# Patient Record
Sex: Male | Born: 1985 | Race: White | Hispanic: No | Marital: Married | Smoking: Never smoker
Health system: Southern US, Community
[De-identification: ages and names within clinical notes are randomized; demographics above are authoritative.]

## PROBLEM LIST (undated history)

## (undated) DIAGNOSIS — N200 Calculus of kidney: Secondary | ICD-10-CM

## (undated) DIAGNOSIS — Z87442 Personal history of urinary calculi: Secondary | ICD-10-CM

## (undated) HISTORY — PX: OTHER SURGICAL HISTORY: SHX169

## (undated) HISTORY — DX: Calculus of kidney: N20.0

---

## 2009-03-13 ENCOUNTER — Ambulatory Visit: Payer: Self-pay | Admitting: Internal Medicine

## 2009-07-01 ENCOUNTER — Ambulatory Visit: Payer: Self-pay | Admitting: Internal Medicine

## 2010-05-07 ENCOUNTER — Emergency Department: Payer: Self-pay | Admitting: Emergency Medicine

## 2010-07-24 ENCOUNTER — Ambulatory Visit: Payer: Self-pay | Admitting: Internal Medicine

## 2012-09-29 ENCOUNTER — Ambulatory Visit: Payer: Self-pay

## 2013-03-17 ENCOUNTER — Ambulatory Visit: Payer: Self-pay | Admitting: Physician Assistant

## 2013-03-17 LAB — RAPID INFLUENZA A&B ANTIGENS (ARMC ONLY)

## 2013-03-17 LAB — RAPID STREP-A WITH REFLX: Micro Text Report: POSITIVE

## 2013-04-27 ENCOUNTER — Ambulatory Visit: Payer: Self-pay

## 2014-06-15 ENCOUNTER — Ambulatory Visit
Admit: 2014-06-15 | Disposition: A | Discharge status: Home / Self Care | Payer: Self-pay | Attending: Family Medicine | Admitting: Family Medicine

## 2014-06-15 LAB — RAPID STREP-A WITH REFLX: Micro Text Report: NEGATIVE

## 2014-06-18 LAB — BETA STREP CULTURE(ARMC)

## 2016-05-09 ENCOUNTER — Encounter: Payer: Self-pay | Admitting: Emergency Medicine

## 2016-05-09 ENCOUNTER — Emergency Department
Admission: EM | Admit: 2016-05-09 | Discharge: 2016-05-09 | Disposition: A | Discharge status: Home / Self Care | Payer: BLUE CROSS/BLUE SHIELD | Attending: Emergency Medicine | Admitting: Emergency Medicine

## 2016-05-09 ENCOUNTER — Emergency Department: Payer: BLUE CROSS/BLUE SHIELD

## 2016-05-09 DIAGNOSIS — R109 Unspecified abdominal pain: Secondary | ICD-10-CM

## 2016-05-09 DIAGNOSIS — N200 Calculus of kidney: Secondary | ICD-10-CM | POA: Diagnosis not present

## 2016-05-09 DIAGNOSIS — R1032 Left lower quadrant pain: Secondary | ICD-10-CM | POA: Diagnosis present

## 2016-05-09 LAB — URINALYSIS, COMPLETE (UACMP) WITH MICROSCOPIC
Bilirubin Urine: NEGATIVE
Glucose, UA: NEGATIVE mg/dL
Ketones, ur: NEGATIVE mg/dL
Leukocytes, UA: NEGATIVE
Nitrite: NEGATIVE
PH: 5 (ref 5.0–8.0)
Protein, ur: 30 mg/dL — AB
SPECIFIC GRAVITY, URINE: 1.026 (ref 1.005–1.030)

## 2016-05-09 LAB — CBC
HCT: 41.3 % (ref 40.0–52.0)
Hemoglobin: 14.1 g/dL (ref 13.0–18.0)
MCH: 30.8 pg (ref 26.0–34.0)
MCHC: 34.2 g/dL (ref 32.0–36.0)
MCV: 89.9 fL (ref 80.0–100.0)
Platelets: 145 10*3/uL — ABNORMAL LOW (ref 150–440)
RBC: 4.59 MIL/uL (ref 4.40–5.90)
RDW: 13.1 % (ref 11.5–14.5)
WBC: 8.6 10*3/uL (ref 3.8–10.6)

## 2016-05-09 LAB — BASIC METABOLIC PANEL
ANION GAP: 7 (ref 5–15)
BUN: 13 mg/dL (ref 6–20)
CHLORIDE: 102 mmol/L (ref 101–111)
CO2: 24 mmol/L (ref 22–32)
Calcium: 8.9 mg/dL (ref 8.9–10.3)
Creatinine, Ser: 1.18 mg/dL (ref 0.61–1.24)
GFR calc Af Amer: >60 mL/min (ref >60)
GFR calc non Af Amer: >60 mL/min (ref >60)
Glucose, Bld: 109 mg/dL — ABNORMAL HIGH (ref 65–99)
Potassium: 4 mmol/L (ref 3.5–5.1)
Sodium: 133 mmol/L — ABNORMAL LOW (ref 135–145)

## 2016-05-09 MED ORDER — HYDROMORPHONE HCL 2 MG PO TABS: 2.0000 mg | ORAL_TABLET | Freq: Two times a day (BID) | ORAL | 0 refills | Status: DC | PRN

## 2016-05-09 MED ORDER — ONDANSETRON HCL 4 MG/2ML IJ SOLN
4.0000 mg | Freq: Once | INTRAMUSCULAR | Status: AC
  Administered 2016-05-09: 4 mg via INTRAVENOUS
  Filled 2016-05-09: qty 2

## 2016-05-09 MED ORDER — KETOROLAC TROMETHAMINE 30 MG/ML IJ SOLN
15.0000 mg | Freq: Once | INTRAMUSCULAR | Status: AC
  Administered 2016-05-09: 15 mg via INTRAVENOUS
  Filled 2016-05-09: qty 1

## 2016-05-09 MED ORDER — OXYCODONE-ACETAMINOPHEN 5-325 MG PO TABS
1.0000 | ORAL_TABLET | ORAL | Status: DC | PRN
  Administered 2016-05-09: 1 via ORAL

## 2016-05-09 MED ORDER — ONDANSETRON HCL 4 MG PO TABS: 4.0000 mg | ORAL_TABLET | Freq: Three times a day (TID) | ORAL | 0 refills | Status: DC | PRN

## 2016-05-09 MED ORDER — IBUPROFEN 800 MG PO TABS: 800.0000 mg | ORAL_TABLET | Freq: Three times a day (TID) | ORAL | 0 refills | Status: DC | PRN

## 2016-05-09 MED ORDER — OXYCODONE-ACETAMINOPHEN 5-325 MG PO TABS
ORAL_TABLET | ORAL | Status: AC
  Filled 2016-05-09: qty 1

## 2016-05-09 MED ORDER — TAMSULOSIN HCL 0.4 MG PO CAPS: 0.4000 mg | ORAL_CAPSULE | Freq: Every day | ORAL | 0 refills | Status: AC

## 2016-05-09 MED ORDER — SODIUM CHLORIDE 0.9 % IV BOLUS (SEPSIS)
500.0000 mL | Freq: Once | INTRAVENOUS | Status: AC
  Administered 2016-05-09: 500 mL via INTRAVENOUS

## 2016-05-09 MED ORDER — HYDROMORPHONE HCL 1 MG/ML IJ SOLN
1.0000 mg | Freq: Once | INTRAMUSCULAR | Status: AC
  Administered 2016-05-09: 1 mg via INTRAVENOUS
  Filled 2016-05-09: qty 1

## 2016-05-09 MED ORDER — ONDANSETRON 4 MG PO TBDP
4.0000 mg | ORAL_TABLET | Freq: Once | ORAL | Status: AC | PRN
  Administered 2016-05-09: 4 mg via ORAL
  Filled 2016-05-09: qty 1

## 2016-05-09 NOTE — ED Notes (Signed)
Pt reports that he is having left flank pain that radiates into back - pain has been present since 9am - denies difficulty/pain/frequency with urination - urine does appear darker in color today - pt denies history of kidney stones - reports nausea - denies vomiting

## 2016-05-09 NOTE — ED Provider Notes (Signed)
Time Seen: Approximately 2007  I have reviewed the triage notes  Chief Complaint: Flank Pain   History of Present Illness: Nathan Flynn is a 31 y.o. male who presents with acute onset of left-sided lower abdominal pain and flank discomfort on the left. Patient states he has difficulty standing still. He took ibuprofen at home for pain. He has never had a history of renal colic. He denies any dysuria, hematuria or urinary frequency. He's had some nausea and periodic dry heaves without any hematemesis or biliary emesis.   History reviewed. No pertinent past medical history.  There are no active problems to display for this patient.   History reviewed. No pertinent surgical history.  History reviewed. No pertinent surgical history.    Allergies:  Septra [sulfamethoxazole-trimethoprim]  Family History: History reviewed. No pertinent family history.  Social History: Social History  Substance Use Topics  . Smoking status: Never Smoker  . Smokeless tobacco: Never Used  . Alcohol use No     Review of Systems:   10 point review of systems was performed and was otherwise negative:  Constitutional: No fever Eyes: No visual disturbances ENT: No sore throat, ear pain Cardiac: No chest pain Respiratory: No shortness of breath, wheezing, or stridor Abdomen:Left-sided lower abdominal pain to left flank pain with mild radiation toward the left groin area Endocrine: No weight loss, No night sweats Extremities: No peripheral edema, cyanosis Skin: No rashes, easy bruising Neurologic: No focal weakness, trouble with speech or swollowing Urologic: No dysuria, Hematuria, or urinary frequency He denies any testicular pain or masses  Physical Exam:  ED Triage Vitals  Enc Vitals Group     BP 05/09/16 1855 (!) 171/78     Pulse Rate 05/09/16 1855 (!) 102     Resp 05/09/16 1855 18     Temp 05/09/16 1855 99.1 F (37.3 C)     Temp Source 05/09/16 1855 Oral     SpO2  05/09/16 1855 99 %     Weight 05/09/16 1858 (!) 450 lb (204.1 kg)     Height 05/09/16 1858 6' (1.829 m)     Head Circumference --      Peak Flow --      Pain Score 05/09/16 1858 8     Pain Loc --      Pain Edu? --      Excl. in GC? --     General: Awake , Alert , and Oriented times 3; GCS 15 Head: Normal cephalic , atraumatic Eyes: Pupils equal , round, reactive to light Nose/Throat: No nasal drainage, patent upper airway without erythema or exudate.  Neck: Supple, Full range of motion, No anterior adenopathy or palpable thyroid masses Lungs: Clear to ascultation without wheezes , rhonchi, or rales Heart: Regular rate, regular rhythm without murmurs , gallops , or rubs Abdomen: Soft, non tender without rebound, guarding , or rigidity; bowel sounds positive and symmetric in all 4 quadrants. No organomegaly .        Extremities: 2 plus symmetric pulses. No edema, clubbing or cyanosis Neurologic: normal ambulation, Motor symmetric without deficits, sensory intact Skin: warm, dry, no rashes   Labs:   All laboratory work was reviewed including any pertinent negatives or positives listed below:  Labs Reviewed  URINALYSIS, COMPLETE (UACMP) WITH MICROSCOPIC - Abnormal; Notable for the following:       Result Value   Color, Urine YELLOW (*)    APPearance CLEAR (*)    Hgb urine dipstick LARGE (*)  Protein, ur 30 (*)    Bacteria, UA RARE (*)    Squamous Epithelial / LPF 0-5 (*)    All other components within normal limits  BASIC METABOLIC PANEL - Abnormal; Notable for the following:    Sodium 133 (*)    Glucose, Bld 109 (*)    All other components within normal limits  CBC - Abnormal; Notable for the following:    Platelets 145 (*)    All other components within normal limits    Radiology:  "Ct Renal Stone Study  Result Date: 05/09/2016 CLINICAL DATA:  Left flank pain radiating to left lower quadrant. Dark urine. Nausea. EXAM: CT ABDOMEN AND PELVIS WITHOUT CONTRAST TECHNIQUE:  Multidetector CT imaging of the abdomen and pelvis was performed following the standard protocol without IV contrast. COMPARISON:  None. FINDINGS: Lower chest: Lung bases are normal. Hepatobiliary: Within normal. Pancreas: Within normal. Spleen: Within normal. Adrenals/Urinary Tract: Adrenal glands are normal. Kidneys are normal in size with mild bilateral nephrolithiasis. Mild dilatation of the left intrarenal collecting system as there is a 7 mm stone at the UPJ causing this low-grade obstruction. Mild stranding of the left perinephric fat. Remainder of the left ureter is normal. The right ureter is normal. Bladder is unremarkable. Stomach/Bowel: Stomach and small bowel are within normal. Appendix is normal. Colon is within normal. Vascular/Lymphatic: Within normal. Reproductive: Within normal. Other: None. Musculoskeletal: Mild degenerate change of the spine. 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 Mild degenerate change of the hips. IMPRESSION: Mild bilateral nephrolithiasis. 7 mm stone at the left UPJ causing low-grade obstruction. Electronically Signed   By: Elberta Fortisaniel  Boyle M.D.   On: 05/09/2016 21:29  "  I personally reviewed the radiologic studies     ED Course: * Patient's stay here was uneventful he had little relief with oral narcotics was given IV Toradol, IV Dilaudid, and IV Zofran with symptomatic relief. It appears he has a fairly large kidney stone located in the left UPJ region. Patient was referred to urology unassigned and advised to strain his urine and save the stone if one becomes available.     Assessment: Left-sided renal colic Final Clinical Impression:  Final diagnoses:  Left flank discomfort     Plan:  Outpatient " New Prescriptions   HYDROMORPHONE (DILAUDID) 2 MG TABLET    Take 1 tablet (2 mg total) by mouth every 12 (twelve) hours as needed for severe pain.   IBUPROFEN (ADVIL,MOTRIN) 800 MG TABLET    Take 1 tablet (800 mg total) by mouth every 8 (eight) hours as needed.    ONDANSETRON (ZOFRAN) 4 MG TABLET    Take 1 tablet (4 mg total) by mouth every 8 (eight) hours as needed for nausea or vomiting.   TAMSULOSIN (FLOMAX) 0.4 MG CAPS CAPSULE    Take 1 capsule (0.4 mg total) by mouth daily.  " Patient was advised to return immediately if condition worsens. Patient was advised to follow up with their primary care physician or other specialized physicians involved in their outpatient care. The patient and/or family member/power of attorney had laboratory results reviewed at the bedside. All questions and concerns were addressed and appropriate discharge instructions were distributed by the nursing staff.             Jennye MoccasinBrian S Quigley, MD 05/09/16 2154

## 2016-05-09 NOTE — ED Triage Notes (Addendum)
C/o left flank pain radiating to LLQ. Denies blood in urine but is dark in color per pt. Has had nausea. Denies fevers. Appears to hurt but no distress currently. Has episode of this pain last Saturday but went away same day.

## 2016-05-09 NOTE — Discharge Instructions (Signed)
Please drink adequate fluids and strain urine and stated this started one becomes available. Please return emergency Department for a fever, uncontrolled vomiting, uncontrolled pain. Please contact urologist on Monday for further outpatient follow-up as directed.  Please return immediately if condition worsens. Please contact her primary physician or the physician you were given for referral. If you have any specialist physicians involved in her treatment and plan please also contact them. Thank you for using  regional emergency Department.

## 2016-05-21 ENCOUNTER — Other Ambulatory Visit: Payer: Self-pay | Admitting: Radiology

## 2016-05-21 ENCOUNTER — Encounter: Payer: Self-pay | Admitting: Urology

## 2016-05-21 ENCOUNTER — Ambulatory Visit: Payer: BLUE CROSS/BLUE SHIELD | Admitting: Urology

## 2016-05-21 VITALS — BP 150/82 | HR 109 | Ht 72.0 in | Wt >= 6400 oz

## 2016-05-21 DIAGNOSIS — R310 Gross hematuria: Secondary | ICD-10-CM | POA: Diagnosis not present

## 2016-05-21 DIAGNOSIS — N200 Calculus of kidney: Secondary | ICD-10-CM

## 2016-05-21 DIAGNOSIS — N132 Hydronephrosis with renal and ureteral calculous obstruction: Secondary | ICD-10-CM

## 2016-05-21 DIAGNOSIS — N201 Calculus of ureter: Secondary | ICD-10-CM

## 2016-05-21 LAB — URINALYSIS, COMPLETE
Bilirubin, UA: NEGATIVE
Glucose, UA: NEGATIVE
Ketones, UA: NEGATIVE
Leukocytes, UA: NEGATIVE
Nitrite, UA: NEGATIVE
PH UA: 5.5 (ref 5.0–7.5)
PROTEIN UA: NEGATIVE
Specific Gravity, UA: 1.025 (ref 1.005–1.030)
UUROB: 0.2 mg/dL (ref 0.2–1.0)

## 2016-05-21 LAB — MICROSCOPIC EXAMINATION

## 2016-05-21 MED ORDER — TAMSULOSIN HCL 0.4 MG PO CAPS: 0.4000 mg | ORAL_CAPSULE | Freq: Every day | ORAL | 0 refills | Status: DC

## 2016-05-21 MED ORDER — HYDROMORPHONE HCL 2 MG PO TABS: 2.0000 mg | ORAL_TABLET | Freq: Two times a day (BID) | ORAL | 0 refills | Status: DC | PRN

## 2016-05-21 NOTE — Progress Notes (Signed)
  05/21/2016 4:35 PM   Nathan Flynn 03/19/1985 [id number]  Referring provider: No referring provider defined for this encounter.  Chief Complaint  Patient presents with  . New Patient (Initial Visit)    kidney stone referred by ER     HPI: Patient is a 31 year old Caucasian male who presents/is referred by [facility]'s ED for nephrolithiasis.  Patient states the onset of the pain was 2 weeks ago.   It was sharp.  It lasted for a few hours  The pain was located left flank and radiated to left abdomen.    The pain was a 10/10.  Narcotics made the pain better.   Nothing made the pain worse.  He did have gross hematuria, nausea and vomiting.  In the ED , hydromorphone, ketorolac and ondansetrone.   His UA demonstrated TNTC RBC's.   Serum creatinine was 1.18.  WBC count was 8.6.    CT Renal stone study performed on 05/09/2016 noted mild bilateral nephrolithiasis. 7 mm stone at the left UPJ causing low-grade obstruction.  I have independently reviewed the films.    Today, he is having left flank pain.  He is unable to get comfortable in the room.  He denies gross hematuria and dysuria.  He denies fevers, chills and vomiting.  He is having nausea.  UA today is positive 11-30 RBC's.    He does not have a prior history of stones.      PMH: Past Medical History:  Diagnosis Date  . Kidney stone     Surgical History: History reviewed. No pertinent surgical history.  Home Medications:  Allergies as of 05/21/2016      Reactions   Septra [sulfamethoxazole-trimethoprim]    "twin was allergic so told me not to take"      Medication List       Accurate as of 05/21/16  4:35 PM. Always use your most recent med list.          acetaminophen 500 MG chewable tablet Commonly known as:  TYLENOL Chew 500 mg by mouth every 6 (six) hours as needed for pain.   HYDROmorphone 2 MG tablet Commonly known as:  DILAUDID Take 1 tablet (2 mg total) by mouth every 12 (twelve) hours as  needed for severe pain.   ibuprofen 800 MG tablet Commonly known as:  ADVIL,MOTRIN Take 1 tablet (800 mg total) by mouth every 8 (eight) hours as needed.   ondansetron 4 MG tablet Commonly known as:  ZOFRAN Take 1 tablet (4 mg total) by mouth every 8 (eight) hours as needed for nausea or vomiting.   tamsulosin 0.4 MG Caps capsule Commonly known as:  FLOMAX Take 1 capsule (0.4 mg total) by mouth daily.       Allergies:  Allergies  Allergen Reactions  . Septra [Sulfamethoxazole-Trimethoprim]     "twin was allergic so told me not to take"    Family History: Family History  Problem Relation Age of Onset  . Prostate cancer Neg Hx   . Kidney cancer Neg Hx   . Bladder Cancer Neg Hx     Social History:  reports that he has never smoked. He has never used smokeless tobacco. He reports that he does not drink alcohol or use drugs.  ROS: UROLOGY Frequent Urination?: Yes Hard to postpone urination?: No Burning/pain with urination?: Yes Get up at night to urinate?: No Leakage of urine?: No Urine stream starts and stops?: No Trouble starting stream?: No Do you have to strain   to urinate?: No Blood in urine?: Yes Urinary tract infection?: No Sexually transmitted disease?: No Injury to kidneys or bladder?: No Painful intercourse?: No Weak stream?: No Erection problems?: No Penile pain?: No  Gastrointestinal Nausea?: Yes Vomiting?: Yes Indigestion/heartburn?: No Diarrhea?: No Constipation?: Yes  Constitutional Fever: No Night sweats?: No Weight loss?: No Fatigue?: Yes  Skin Skin rash/lesions?: No Itching?: No  Eyes Blurred vision?: No Double vision?: No  Ears/Nose/Throat Sore throat?: No Sinus problems?: Yes  Hematologic/Lymphatic Swollen glands?: No Easy bruising?: No  Cardiovascular Leg swelling?: Yes Chest pain?: No  Respiratory Cough?: No Shortness of breath?: Yes  Endocrine Excessive thirst?: No  Musculoskeletal Back pain?: Yes Joint  pain?: No  Neurological Headaches?: Yes Dizziness?: Yes  Psychologic Depression?: No Anxiety?: No  Physical Exam: BP (!) 150/82   Pulse (!) 109   Ht 6' (1.829 m)   Wt (!) 483 lb 3.2 oz (219.2 kg)   BMI 65.53 kg/m   Constitutional: Well nourished. Alert and oriented, No acute distress. HEENT: [place] AT, moist mucus membranes. Trachea midline, no masses. Cardiovascular: No clubbing, cyanosis, or edema. Respiratory: Normal respiratory effort, no increased work of breathing. GI: Abdomen is soft, non tender, non distended, no abdominal masses. Liver and spleen not palpable.  No hernias appreciated.  Stool sample for occult testing is not indicated.   GU: No CVA tenderness.  No bladder fullness or masses.   Skin: No rashes, bruises or suspicious lesions. Lymph: No cervical or inguinal adenopathy. Neurologic: Grossly intact, no focal deficits, moving all 4 extremities. Psychiatric: Normal mood and affect.  Laboratory Data: Lab Results  Component Value Date   WBC 8.6 05/09/2016   HGB 14.1 05/09/2016   HCT 41.3 05/09/2016   MCV 89.9 05/09/2016   PLT 145 (L) 05/09/2016    Lab Results  Component Value Date   CREATININE 1.18 05/09/2016   Urinalysis 11-30 RBC's.  See EPIC.   Pertinent Imaging: CLINICAL DATA:  Left flank pain radiating to left lower quadrant. Dark urine. Nausea.  EXAM: CT ABDOMEN AND PELVIS WITHOUT CONTRAST  TECHNIQUE: Multidetector CT imaging of the abdomen and pelvis was performed following the standard protocol without IV contrast.  COMPARISON:  None.  FINDINGS: Lower chest: Lung bases are normal.  Hepatobiliary: Within normal.  Pancreas: Within normal.  Spleen: Within normal.  Adrenals/Urinary Tract: Adrenal glands are normal. Kidneys are normal in size with mild bilateral nephrolithiasis. Mild dilatation of the left intrarenal collecting system as there is a 7 mm stone at the UPJ causing this low-grade obstruction. Mild stranding of  the left perinephric fat. Remainder of the left ureter is normal. The right ureter is normal. Bladder is unremarkable.  Stomach/Bowel: Stomach and small bowel are within normal. Appendix is normal. Colon is within normal.  Vascular/Lymphatic: Within normal.  Reproductive: Within normal.  Other: None.  Musculoskeletal: Mild degenerate change of the spine. 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 Mild degenerate change of the hips.  IMPRESSION: Mild bilateral nephrolithiasis. 7 mm stone at the left UPJ causing low-grade obstruction.   Electronically Signed   By: Daniel  Boyle M.D.   On: 05/09/2016 21:29   Assessment & Plan:   Patient will undergo left ureteroscopy with left laser lithotripsy with left ureteral stent placement for definitive treatment of a left 7 mm UPJ and left renal stone.    1. Left UPJ stone  - discussed MET vs ESWL vs URS/LL/ureteral stent placement and the success rate of each - recommend URS/LL/ureteral stent placement as it would give   him the best chance of becoming stone free due to size of the stone and the skin to stone distance  - schedule LEFT ureteroscopy with laser lithotripsy and ureteral stent placement  - explained to the patient how the procedure is performed and the risks involved  - informed patient that they will have a stent placed during the procedure and will remain in place after the procedure for a short time.   - stent may be removed in the office with a cystoscope or patient may be instructed to remove the stent themselves by the string  - described "stent pain" as feelings of needing to urinate/overactive bladder and a warm, tingling sensation to intense pain in the affected flank  - residual stones within the kidney or ureter may be present after the procedure and may need to have these addressed at a different encounter  - injury to the ureter is the most common intra-operative risk, it may result in an open procedure to correct the  defect  - infection and bleeding are also risks  - explained the risks of general anesthesia, such as: MI, CVA, paralysis, coma and/or death.  - advised to contact our office or seek treatment in the ED if becomes febrile or pain/ vomiting are difficult control in order to arrange for emergent/urgent intervention  2. Left renal stone  - see above  3. Left hydronephrosis  - obtain RUS to ensure the hydronephrosis has resolved once intervention has completed  3. Gross hematuria  - UA today demonstrates 11-30 RBC's.    - continue to monitor the patient's UA after the treatment/passage of the stone to ensure the hematuria has resolved  - if hematuria persists, we will pursue a hematuria workup with CT Urogram and cystoscopy if appropriate.    Return for left URS/LL/ureteral stent placement.  These notes generated with voice recognition software. I apologize for typographical errors.  Shamir Tuzzolino, PA-C  [facility] [address], [address] [place], [place] [address] [phone]  

## 2016-05-22 ENCOUNTER — Encounter
Admission: RE | Admit: 2016-05-22 | Discharge: 2016-05-22 | Disposition: A | Discharge status: Home / Self Care | Payer: BLUE CROSS/BLUE SHIELD | Source: Ambulatory Visit | Attending: Urology | Admitting: Urology

## 2016-05-22 DIAGNOSIS — N135 Crossing vessel and stricture of ureter without hydronephrosis: Secondary | ICD-10-CM | POA: Diagnosis not present

## 2016-05-22 DIAGNOSIS — Z883 Allergy status to other anti-infective agents status: Secondary | ICD-10-CM | POA: Diagnosis not present

## 2016-05-22 DIAGNOSIS — Z87442 Personal history of urinary calculi: Secondary | ICD-10-CM | POA: Diagnosis not present

## 2016-05-22 DIAGNOSIS — Z79899 Other long term (current) drug therapy: Secondary | ICD-10-CM | POA: Diagnosis not present

## 2016-05-22 DIAGNOSIS — Z79891 Long term (current) use of opiate analgesic: Secondary | ICD-10-CM | POA: Diagnosis not present

## 2016-05-22 DIAGNOSIS — Z791 Long term (current) use of non-steroidal anti-inflammatories (NSAID): Secondary | ICD-10-CM | POA: Diagnosis not present

## 2016-05-22 DIAGNOSIS — R310 Gross hematuria: Secondary | ICD-10-CM | POA: Diagnosis not present

## 2016-05-22 DIAGNOSIS — N201 Calculus of ureter: Secondary | ICD-10-CM | POA: Diagnosis present

## 2016-05-22 NOTE — Patient Instructions (Signed)
Your procedure is scheduled on: 05/25/16 at 10:45 Report to DAY SURGERY. 2ND FLOOR MEDICAL MALL ENTRANCE. To find out your arrival time please call [phone] between 1PM - 3PM on .  Remember: Instructions that are not followed completely may result in serious medical risk, up to and including death, or upon the discretion of your surgeon and anesthesiologist your surgery may need to be rescheduled.    __X__ 1. Do not eat food or drink liquids after midnight. No gum chewing or hard candies.     __X__ 2. No Alcohol for 24 hours before or after surgery.   ____ 3. Bring all medications with you on the day of surgery if instructed.    __X__ 4. Notify your doctor if there is any change in your medical condition     (cold, fever, infections).             ___X__5. No smoking within 24 hours of your surgery.     Do not wear jewelry, make-up, hairpins, clips or nail polish.  Do not wear lotions, powders, or perfumes.   Do not shave 48 hours prior to surgery. Men may shave face and neck.  Do not bring valuables to the hospital.    [facility] is not responsible for any belongings or valuables.               Contacts, dentures or bridgework may not be worn into surgery.  Leave your suitcase in the car. After surgery it may be brought to your room.  For patients admitted to the hospital, discharge time is determined by your                treatment team.   Patients discharged the day of surgery will not be allowed to drive home.   Please read over the following fact sheets that you were given:   Pain Booklet and MRSA Information   ____ Take these medicines the morning of surgery with A SIP OF WATER:    1. MAY TAKE PAIN MEDICINE IF NEEDED  2.   3.   4.  5.  6.  ____ Fleet Enema (as directed)   ____ Use CHG Soap as directed  ____ Use inhalers on the day of surgery  ____ Stop metformin 2 days prior to surgery    ____ Take 1/2 of usual insulin dose the night before surgery and none  on the morning of surgery.   ____ Stop Coumadin/Plavix/aspirin on   __X__ Stop Anti-inflammatories such as Advil, Aleve, Ibuprofen, Motrin, Naproxen, Naprosyn, Goodies,powder, or aspirin products.  OK to take Tylenol.   ____ Stop supplements until after surgery.    ____ Bring C-Pap to the hospital.

## 2016-05-24 LAB — CULTURE, URINE COMPREHENSIVE

## 2016-05-24 MED ORDER — CEFAZOLIN SODIUM-DEXTROSE 2-4 GM/100ML-% IV SOLN
2.0000 g | INTRAVENOUS | Status: AC
  Administered 2016-05-25: 2 g via INTRAVENOUS

## 2016-05-25 ENCOUNTER — Ambulatory Visit: Payer: BLUE CROSS/BLUE SHIELD | Admitting: Anesthesiology

## 2016-05-25 ENCOUNTER — Ambulatory Visit: Payer: BLUE CROSS/BLUE SHIELD

## 2016-05-25 ENCOUNTER — Encounter
Admission: RE | Disposition: A | Discharge status: Home / Self Care | Payer: Self-pay | Source: Ambulatory Visit | Attending: Urology

## 2016-05-25 ENCOUNTER — Telehealth: Payer: Self-pay | Admitting: Urology

## 2016-05-25 ENCOUNTER — Encounter: Payer: Self-pay | Admitting: *Deleted

## 2016-05-25 ENCOUNTER — Ambulatory Visit
Admission: RE | Admit: 2016-05-25 | Discharge: 2016-05-25 | Disposition: A | Discharge status: Home / Self Care | Payer: BLUE CROSS/BLUE SHIELD | Source: Ambulatory Visit | Attending: Urology | Admitting: Urology

## 2016-05-25 DIAGNOSIS — Z87442 Personal history of urinary calculi: Secondary | ICD-10-CM | POA: Insufficient documentation

## 2016-05-25 DIAGNOSIS — Z791 Long term (current) use of non-steroidal anti-inflammatories (NSAID): Secondary | ICD-10-CM | POA: Insufficient documentation

## 2016-05-25 DIAGNOSIS — R1032 Left lower quadrant pain: Secondary | ICD-10-CM | POA: Diagnosis not present

## 2016-05-25 DIAGNOSIS — N135 Crossing vessel and stricture of ureter without hydronephrosis: Secondary | ICD-10-CM | POA: Diagnosis not present

## 2016-05-25 DIAGNOSIS — N201 Calculus of ureter: Secondary | ICD-10-CM | POA: Insufficient documentation

## 2016-05-25 DIAGNOSIS — Z79899 Other long term (current) drug therapy: Secondary | ICD-10-CM | POA: Insufficient documentation

## 2016-05-25 DIAGNOSIS — Z419 Encounter for procedure for purposes other than remedying health state, unspecified: Secondary | ICD-10-CM

## 2016-05-25 DIAGNOSIS — N132 Hydronephrosis with renal and ureteral calculous obstruction: Secondary | ICD-10-CM

## 2016-05-25 DIAGNOSIS — Z883 Allergy status to other anti-infective agents status: Secondary | ICD-10-CM | POA: Insufficient documentation

## 2016-05-25 DIAGNOSIS — R310 Gross hematuria: Secondary | ICD-10-CM | POA: Insufficient documentation

## 2016-05-25 DIAGNOSIS — Z79891 Long term (current) use of opiate analgesic: Secondary | ICD-10-CM | POA: Insufficient documentation

## 2016-05-25 DIAGNOSIS — N200 Calculus of kidney: Secondary | ICD-10-CM

## 2016-05-25 HISTORY — PX: URETEROSCOPY: SHX842

## 2016-05-25 HISTORY — PX: CYSTOSCOPY WITH STENT PLACEMENT: SHX5790

## 2016-05-25 SURGERY — URETEROSCOPY
Anesthesia: General | Site: Ureter | Laterality: Left | Wound class: Clean Contaminated

## 2016-05-25 MED ORDER — MIDAZOLAM HCL 2 MG/2ML IJ SOLN
INTRAMUSCULAR | Status: AC
  Filled 2016-05-25: qty 2

## 2016-05-25 MED ORDER — FENTANYL CITRATE (PF) 100 MCG/2ML IJ SOLN
INTRAMUSCULAR | Status: AC
  Filled 2016-05-25: qty 2

## 2016-05-25 MED ORDER — LACTATED RINGERS IV SOLN
INTRAVENOUS | Status: DC
  Administered 2016-05-25: 11:00:00 via INTRAVENOUS

## 2016-05-25 MED ORDER — DEXAMETHASONE SODIUM PHOSPHATE 10 MG/ML IJ SOLN
INTRAMUSCULAR | Status: DC | PRN
  Administered 2016-05-25: 4 mg via INTRAVENOUS

## 2016-05-25 MED ORDER — PROPOFOL 10 MG/ML IV BOLUS
INTRAVENOUS | Status: AC
  Filled 2016-05-25: qty 20

## 2016-05-25 MED ORDER — PROPOFOL 10 MG/ML IV BOLUS
INTRAVENOUS | Status: DC | PRN
  Administered 2016-05-25: 300 mg via INTRAVENOUS

## 2016-05-25 MED ORDER — MIDAZOLAM HCL 2 MG/2ML IJ SOLN
INTRAMUSCULAR | Status: DC | PRN
  Administered 2016-05-25: 2 mg via INTRAVENOUS

## 2016-05-25 MED ORDER — ONDANSETRON HCL 4 MG/2ML IJ SOLN
INTRAMUSCULAR | Status: DC | PRN
  Administered 2016-05-25: 4 mg via INTRAVENOUS

## 2016-05-25 MED ORDER — HYDROMORPHONE HCL 2 MG PO TABS
ORAL_TABLET | ORAL | Status: AC
  Filled 2016-05-25: qty 1

## 2016-05-25 MED ORDER — ONDANSETRON HCL 4 MG/2ML IJ SOLN: 4.0000 mg | Freq: Once | INTRAMUSCULAR | Status: DC | PRN

## 2016-05-25 MED ORDER — HYDROMORPHONE HCL 2 MG PO TABS: 2.0000 mg | ORAL_TABLET | Freq: Two times a day (BID) | ORAL | 0 refills | Status: DC | PRN

## 2016-05-25 MED ORDER — CEFAZOLIN SODIUM 1 G IJ SOLR
INTRAMUSCULAR | Status: DC | PRN
  Administered 2016-05-25: 1 g via INTRAMUSCULAR

## 2016-05-25 MED ORDER — LIDOCAINE HCL (CARDIAC) 20 MG/ML IV SOLN
INTRAVENOUS | Status: DC | PRN
  Administered 2016-05-25: 100 mg via INTRAVENOUS

## 2016-05-25 MED ORDER — FAMOTIDINE 20 MG PO TABS
ORAL_TABLET | ORAL | Status: AC
  Filled 2016-05-25: qty 1

## 2016-05-25 MED ORDER — FAMOTIDINE 20 MG PO TABS
20.0000 mg | ORAL_TABLET | Freq: Once | ORAL | Status: AC
  Administered 2016-05-25: 20 mg via ORAL

## 2016-05-25 MED ORDER — CEFAZOLIN SODIUM-DEXTROSE 2-4 GM/100ML-% IV SOLN
INTRAVENOUS | Status: AC
  Filled 2016-05-25: qty 100

## 2016-05-25 MED ORDER — HYDROMORPHONE HCL 2 MG PO TABS
2.0000 mg | ORAL_TABLET | Freq: Two times a day (BID) | ORAL | Status: DC | PRN
  Administered 2016-05-25: 2 mg via ORAL

## 2016-05-25 MED ORDER — ONDANSETRON HCL 4 MG/2ML IJ SOLN
INTRAMUSCULAR | Status: AC
  Filled 2016-05-25: qty 2

## 2016-05-25 MED ORDER — SUCCINYLCHOLINE CHLORIDE 20 MG/ML IJ SOLN
INTRAMUSCULAR | Status: AC
  Filled 2016-05-25: qty 1

## 2016-05-25 MED ORDER — PROMETHAZINE HCL 12.5 MG RE SUPP: 12.5000 mg | Freq: Four times a day (QID) | RECTAL | 0 refills | Status: DC | PRN

## 2016-05-25 MED ORDER — FENTANYL CITRATE (PF) 100 MCG/2ML IJ SOLN: 25.0000 ug | INTRAMUSCULAR | Status: DC | PRN

## 2016-05-25 MED ORDER — OXYBUTYNIN CHLORIDE 5 MG PO TABS: 5.0000 mg | ORAL_TABLET | Freq: Three times a day (TID) | ORAL | 0 refills | Status: DC | PRN

## 2016-05-25 MED ORDER — OXYBUTYNIN CHLORIDE 5 MG PO TABS
5.0000 mg | ORAL_TABLET | Freq: Once | ORAL | Status: AC
  Administered 2016-05-25: 5 mg via ORAL
  Filled 2016-05-25: qty 1

## 2016-05-25 MED ORDER — TAMSULOSIN HCL 0.4 MG PO CAPS: 0.4000 mg | ORAL_CAPSULE | Freq: Every day | ORAL | 0 refills | Status: DC

## 2016-05-25 MED ORDER — FENTANYL CITRATE (PF) 100 MCG/2ML IJ SOLN
INTRAMUSCULAR | Status: DC | PRN
  Administered 2016-05-25: 100 ug via INTRAVENOUS
  Administered 2016-05-25 (×2): 50 ug via INTRAVENOUS

## 2016-05-25 MED ORDER — SUCCINYLCHOLINE CHLORIDE 20 MG/ML IJ SOLN
INTRAMUSCULAR | Status: DC | PRN
  Administered 2016-05-25: 180 mg via INTRAVENOUS

## 2016-05-25 MED ORDER — IOTHALAMATE MEGLUMINE 43 % IV SOLN
INTRAVENOUS | Status: DC | PRN
Start: 2016-05-25 — End: 2016-05-25
  Administered 2016-05-25: 20 mL

## 2016-05-25 SURGICAL SUPPLY — 32 items
BAG DRAIN CYSTO-URO LG1000N (MISCELLANEOUS) ×4 IMPLANT
BASKET ZERO TIP 1.9FR (BASKET) IMPLANT
CATH URETL 5X70 OPEN END (CATHETERS) ×4 IMPLANT
CNTNR SPEC 2.5X3XGRAD LEK (MISCELLANEOUS) ×2
CONRAY 43 FOR UROLOGY 50M (MISCELLANEOUS) ×4 IMPLANT
CONT SPEC 4OZ STER OR WHT (MISCELLANEOUS) ×2
CONTAINER SPEC 2.5X3XGRAD LEK (MISCELLANEOUS) ×2 IMPLANT
DRAPE C-ARM XRAY 36X54 (DRAPES) ×8 IMPLANT
DRAPE UTILITY 15X26 TOWEL STRL (DRAPES) ×4 IMPLANT
GLOVE BIO SURGEON STRL SZ 6.5 (GLOVE) ×3 IMPLANT
GLOVE BIO SURGEONS STRL SZ 6.5 (GLOVE) ×1
GOWN STRL REUS W/ TWL LRG LVL3 (GOWN DISPOSABLE) ×4 IMPLANT
GOWN STRL REUS W/TWL LRG LVL3 (GOWN DISPOSABLE) ×4
GUIDEWIRE GREEN .038 145CM (MISCELLANEOUS) ×4 IMPLANT
INFUSOR MANOMETER BAG 3000ML (MISCELLANEOUS) ×4 IMPLANT
INTRODUCER DILATOR DOUBLE (INTRODUCER) IMPLANT
KIT RM TURNOVER CYSTO AR (KITS) ×4 IMPLANT
PACK CYSTO AR (MISCELLANEOUS) ×4 IMPLANT
SCRUB POVIDONE IODINE 4 OZ (MISCELLANEOUS) ×4 IMPLANT
SENSORWIRE 0.038 NOT ANGLED (WIRE) ×4
SET CYSTO W/LG BORE CLAMP LF (SET/KITS/TRAYS/PACK) ×4 IMPLANT
SET DILATOR URETRAL 8.5FR (MISCELLANEOUS) ×4 IMPLANT
SHEATH URETERAL 12FRX35CM (MISCELLANEOUS) IMPLANT
SOL .9 NS 3000ML IRR  AL (IV SOLUTION) ×2
SOL .9 NS 3000ML IRR UROMATIC (IV SOLUTION) ×2 IMPLANT
STENT URET 6FRX24 CONTOUR (STENTS) IMPLANT
STENT URET 6FRX26 CONTOUR (STENTS) IMPLANT
STENT URET 6FRX28 CONTOUR (STENTS) ×4 IMPLANT
SURGILUBE 2OZ TUBE FLIPTOP (MISCELLANEOUS) ×4 IMPLANT
SYRINGE IRR TOOMEY STRL 70CC (SYRINGE) ×4 IMPLANT
WATER STERILE IRR 1000ML POUR (IV SOLUTION) ×4 IMPLANT
WIRE SENSOR 0.038 NOT ANGLED (WIRE) ×2 IMPLANT

## 2016-05-25 NOTE — Op Note (Signed)
Date of procedure: 05/25/16  Preoperative diagnosis:  1. Left UPJ stone 2. Left flank pain   Postoperative diagnosis:  1. Same as above   Procedure: 1. Left ureteroscopy 2. Left ureteral stent placement 3. Left retrograde pyelogram  Surgeon: Vanna Scotland, MD  Anesthesia: General  Complications: None  Intraoperative findings: Unable to advance a flexible scope passed iliacs due to patient habitus and ureteral narrowing. Stent placed with plans for staged procedure.  EBL: Minimal  Specimens: None  Drains: 6 x 28 French double-J ureteral stent on left  Indication: Nathan Flynn is a 31 y.o. patient with with a 7 mm left UPJ stone and continued poorly controlled left flank pain.  After reviewing the management options for treatment, he elected to proceed with the above surgical procedure(s). We have discussed the potential benefits and risks of the procedure, side effects of the proposed treatment, the likelihood of the patient achieving the goals of the procedure, and any potential problems that might occur during the procedure or recuperation. Informed consent has been obtained.  Description of procedure:  The patient was taken to the operating room and general anesthesia was induced.  The patient was placed in the dorsal lithotomy position, prepped and draped in the usual sterile fashion, and preoperative antibiotics were administered.  Due to the patient's habitus, extreme care was taken to ensure all padded pressure points and slaughters were used throughout the entire procedure to ensure patient stability on the table and in yellowfins.  A preoperative time-out was performed.   Male sounds were used to gently dilate the fossa navicularis. 21 [place] scope was advanced then per urethra into the bladder. Attention was turned to the left ureteral orifice which was cannulated using a 5 [place] open-ended ureteral catheter. Prior to performing a retrograde pyelogram on the  side, scout films revealed no obvious stone although quality of this was poor based on the patient's habitus. A gentle retrograde pyelogram was then performed revealing some fullness in the collecting system but no overt hydronephrosis. No clear filling defect was appreciated. Wire was then placed up to level of the kidney under fluoroscopic guidance. I then attempted to use a dual lumen introducer within the distal ureter but was not able to advance this easily. Using an open-ended ureteral catheter, a second Super Stiff wire was then placed cystoscopically without difficulty up to the level of the kidney. Then attempted to use an 8/10 dilator to help dilate the UO but was unable to advance the 8 [place] dilator past the iliacs. I then attempted to advance a 4.5 French semirigid ureteroscope to this level but is only able to cannulate and advanced to the distal ureter but not beyond the iliacs due to his habitus. I then used an 8 [place] dual-lumen Wolf ureteroscope again meeting resistance at the iliacs unable to advance the scope easily. I such, the decision was made to return at a later date for staged procedure. The safety wire was then backloaded over a rigid cystoscope. A 6 x 28 French double-J ureteral stent was advanced over the wire up to level of the renal pelvis. The wire was partially withdrawn until full coil was noted within the upper pole calyx. The wire was then fully withdrawn and a full coil was noted within the bladder. The bladder was then drained and the scope was removed. The patient was then carefully repositioned the supine position, reversed from anesthesia, taken to the PACU in stable condition.  Plan: Given the patient's habitus and  risk for general anesthesia, recommended proceeding with CT stone protocol to ensure that the stone remains in place and is not incidentally past in the interim. If the stone remains present, will return to the operating room next week for staged procedure  after passive dilation of the ureter.  Vanna ScotlandAshley Somnang Mahan, M.D.

## 2016-05-25 NOTE — Discharge Instructions (Signed)
You have a ureteral stent in place.  This is a tube that extends from your kidney to your bladder.  This may cause urinary bleeding, burning with urination, and urinary frequency.  Please call our office or present to the ED if you develop fevers >101 or pain which is not able to be controlled with oral pain medications.  You may be given either Flomax and/ or ditropan to help with bladder spasms and stent pain in addition to pain medications.   ° °[facility] °[address], [address] °[place], [place] [address] °[phone] ° ° ° °AMBULATORY SURGERY  °DISCHARGE INSTRUCTIONS ° ° °1) The drugs that you were given will stay in your system until tomorrow so for the next 24 hours you should not: ° °A) Drive an automobile °B) Make any legal decisions °C) Drink any alcoholic beverage ° ° °2) You may resume regular meals tomorrow.  Today it is better to start with liquids and gradually work up to solid foods. ° °You may eat anything you prefer, but it is better to start with liquids, then soup and crackers, and gradually work up to solid foods. ° ° °3) Please notify your doctor immediately if you have any unusual bleeding, trouble breathing, redness and pain at the surgery site, drainage, fever, or pain not relieved by medication. ° ° ° °4) Additional Instructions: ° ° ° ° ° ° ° °Please contact your physician with any problems or Same Day Surgery at [phone], Monday through Friday 6 am to 4 pm, or [place] [facility] Main number at [phone]. °

## 2016-05-25 NOTE — Anesthesia Postprocedure Evaluation (Signed)
Anesthesia Post Note  Patient: Nathan Flynn  Procedure(s) Performed: Procedure(s) (LRB): URETEROSCOPY (Left) CYSTOSCOPY WITH STENT PLACEMENT (Left)  Patient location during evaluation: PACU Anesthesia Type: General Level of consciousness: awake and alert and oriented Pain management: pain level controlled Vital Signs Assessment: post-procedure vital signs reviewed and stable Respiratory status: spontaneous breathing Cardiovascular status: blood pressure returned to baseline Anesthetic complications: no     Last Vitals:  Vitals:   05/25/16 1410 05/25/16 1421  BP: (!) 153/93 140/80  Pulse: 76 69  Resp: 10 14  Temp: 36.4 C 36.3 C    Last Pain:  Vitals:   05/25/16 1421  PainSc: 2                  Leauna Sharber

## 2016-05-25 NOTE — Anesthesia Post-op Follow-up Note (Cosign Needed)
Anesthesia QCDR form completed.        

## 2016-05-25 NOTE — Telephone Encounter (Signed)
Unable to reach stone today during procedure due to patient size and location of the stone.  A stent was placed.  I would like to rebook surgery for next Monday after passage dilation of the ureter.    PRIOR to returning to the OR, I would like to perform CT stone to ensure the stone is still present. Please arrange.  Order placed from PACU.  Patient is aware.    Vanna ScotlandAshley Koleen Celia, MD

## 2016-05-25 NOTE — Transfer of Care (Signed)
Immediate Anesthesia Transfer of Care Note  Patient: Nathan SarksJason Michael Kent  Procedure(s) Performed: Procedure(s): URETEROSCOPY (Left) CYSTOSCOPY WITH STENT PLACEMENT (Left)  Patient Location: PACU  Anesthesia Type:General  Level of Consciousness: sedated and responds to stimulation  Airway & Oxygen Therapy: Patient Spontanous Breathing and Patient connected to face mask oxygen  Post-op Assessment: Report given to RN and Post -op Vital signs reviewed and stable  Post vital signs: Reviewed and stable  Last Vitals:  Vitals:   05/25/16 1101 05/25/16 1325  BP: (!) 186/107 (!) 148/84  Pulse: 93 81  Resp: 16 14  Temp: 36.9 C     Last Pain:  Vitals:   05/25/16 1101  PainSc: 5          Complications: No apparent anesthesia complications

## 2016-05-25 NOTE — H&P (View-Only) (Signed)
  05/21/2016 4:35 PM   Nathan Flynn 10/24/1985 [id number]  Referring provider: No referring provider defined for this encounter.  Chief Complaint  Patient presents with  . New Patient (Initial Visit)    kidney stone referred by ER     HPI: Patient is a 31 year old Caucasian male who presents/is referred by [facility]'s ED for nephrolithiasis.  Patient states the onset of the pain was 2 weeks ago.   It was sharp.  It lasted for a few hours  The pain was located left flank and radiated to left abdomen.    The pain was a 10/10.  Narcotics made the pain better.   Nothing made the pain worse.  He did have gross hematuria, nausea and vomiting.  In the ED , hydromorphone, ketorolac and ondansetrone.   His UA demonstrated TNTC RBC's.   Serum creatinine was 1.18.  WBC count was 8.6.    CT Renal stone study performed on 05/09/2016 noted mild bilateral nephrolithiasis. 7 mm stone at the left UPJ causing low-grade obstruction.  I have independently reviewed the films.    Today, he is having left flank pain.  He is unable to get comfortable in the room.  He denies gross hematuria and dysuria.  He denies fevers, chills and vomiting.  He is having nausea.  UA today is positive 11-30 RBC's.    He does not have a prior history of stones.      PMH: Past Medical History:  Diagnosis Date  . Kidney stone     Surgical History: History reviewed. No pertinent surgical history.  Home Medications:  Allergies as of 05/21/2016      Reactions   Septra [sulfamethoxazole-trimethoprim]    "twin was allergic so told me not to take"      Medication List       Accurate as of 05/21/16  4:35 PM. Always use your most recent med list.          acetaminophen 500 MG chewable tablet Commonly known as:  TYLENOL Chew 500 mg by mouth every 6 (six) hours as needed for pain.   HYDROmorphone 2 MG tablet Commonly known as:  DILAUDID Take 1 tablet (2 mg total) by mouth every 12 (twelve) hours as  needed for severe pain.   ibuprofen 800 MG tablet Commonly known as:  ADVIL,MOTRIN Take 1 tablet (800 mg total) by mouth every 8 (eight) hours as needed.   ondansetron 4 MG tablet Commonly known as:  ZOFRAN Take 1 tablet (4 mg total) by mouth every 8 (eight) hours as needed for nausea or vomiting.   tamsulosin 0.4 MG Caps capsule Commonly known as:  FLOMAX Take 1 capsule (0.4 mg total) by mouth daily.       Allergies:  Allergies  Allergen Reactions  . Septra [Sulfamethoxazole-Trimethoprim]     "twin was allergic so told me not to take"    Family History: Family History  Problem Relation Age of Onset  . Prostate cancer Neg Hx   . Kidney cancer Neg Hx   . Bladder Cancer Neg Hx     Social History:  reports that he has never smoked. He has never used smokeless tobacco. He reports that he does not drink alcohol or use drugs.  ROS: UROLOGY Frequent Urination?: Yes Hard to postpone urination?: No Burning/pain with urination?: Yes Get up at night to urinate?: No Leakage of urine?: No Urine stream starts and stops?: No Trouble starting stream?: No Do you have to strain   to urinate?: No Blood in urine?: Yes Urinary tract infection?: No Sexually transmitted disease?: No Injury to kidneys or bladder?: No Painful intercourse?: No Weak stream?: No Erection problems?: No Penile pain?: No  Gastrointestinal Nausea?: Yes Vomiting?: Yes Indigestion/heartburn?: No Diarrhea?: No Constipation?: Yes  Constitutional Fever: No Night sweats?: No Weight loss?: No Fatigue?: Yes  Skin Skin rash/lesions?: No Itching?: No  Eyes Blurred vision?: No Double vision?: No  Ears/Nose/Throat Sore throat?: No Sinus problems?: Yes  Hematologic/Lymphatic Swollen glands?: No Easy bruising?: No  Cardiovascular Leg swelling?: Yes Chest pain?: No  Respiratory Cough?: No Shortness of breath?: Yes  Endocrine Excessive thirst?: No  Musculoskeletal Back pain?: Yes Joint  pain?: No  Neurological Headaches?: Yes Dizziness?: Yes  Psychologic Depression?: No Anxiety?: No  Physical Exam: BP (!) 150/82   Pulse (!) 109   Ht 6' (1.829 m)   Wt (!) 483 lb 3.2 oz (219.2 kg)   BMI 65.53 kg/m   Constitutional: Well nourished. Alert and oriented, No acute distress. HEENT: [place] AT, moist mucus membranes. Trachea midline, no masses. Cardiovascular: No clubbing, cyanosis, or edema. Respiratory: Normal respiratory effort, no increased work of breathing. GI: Abdomen is soft, non tender, non distended, no abdominal masses. Liver and spleen not palpable.  No hernias appreciated.  Stool sample for occult testing is not indicated.   GU: No CVA tenderness.  No bladder fullness or masses.   Skin: No rashes, bruises or suspicious lesions. Lymph: No cervical or inguinal adenopathy. Neurologic: Grossly intact, no focal deficits, moving all 4 extremities. Psychiatric: Normal mood and affect.  Laboratory Data: Lab Results  Component Value Date   WBC 8.6 05/09/2016   HGB 14.1 05/09/2016   HCT 41.3 05/09/2016   MCV 89.9 05/09/2016   PLT 145 (L) 05/09/2016    Lab Results  Component Value Date   CREATININE 1.18 05/09/2016   Urinalysis 11-30 RBC's.  See EPIC.   Pertinent Imaging: CLINICAL DATA:  Left flank pain radiating to left lower quadrant. Dark urine. Nausea.  EXAM: CT ABDOMEN AND PELVIS WITHOUT CONTRAST  TECHNIQUE: Multidetector CT imaging of the abdomen and pelvis was performed following the standard protocol without IV contrast.  COMPARISON:  None.  FINDINGS: Lower chest: Lung bases are normal.  Hepatobiliary: Within normal.  Pancreas: Within normal.  Spleen: Within normal.  Adrenals/Urinary Tract: Adrenal glands are normal. Kidneys are normal in size with mild bilateral nephrolithiasis. Mild dilatation of the left intrarenal collecting system as there is a 7 mm stone at the UPJ causing this low-grade obstruction. Mild stranding of  the left perinephric fat. Remainder of the left ureter is normal. The right ureter is normal. Bladder is unremarkable.  Stomach/Bowel: Stomach and small bowel are within normal. Appendix is normal. Colon is within normal.  Vascular/Lymphatic: Within normal.  Reproductive: Within normal.  Other: None.  Musculoskeletal: Mild degenerate change of the spine. 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 Mild degenerate change of the hips.  IMPRESSION: Mild bilateral nephrolithiasis. 7 mm stone at the left UPJ causing low-grade obstruction.   Electronically Signed   By: Daniel  Boyle M.D.   On: 05/09/2016 21:29   Assessment & Plan:   Patient will undergo left ureteroscopy with left laser lithotripsy with left ureteral stent placement for definitive treatment of a left 7 mm UPJ and left renal stone.    1. Left UPJ stone  - discussed MET vs ESWL vs URS/LL/ureteral stent placement and the success rate of each - recommend URS/LL/ureteral stent placement as it would give   him the best chance of becoming stone free due to size of the stone and the skin to stone distance  - schedule LEFT ureteroscopy with laser lithotripsy and ureteral stent placement  - explained to the patient how the procedure is performed and the risks involved  - informed patient that they will have a stent placed during the procedure and will remain in place after the procedure for a short time.   - stent may be removed in the office with a cystoscope or patient may be instructed to remove the stent themselves by the string  - described "stent pain" as feelings of needing to urinate/overactive bladder and a warm, tingling sensation to intense pain in the affected flank  - residual stones within the kidney or ureter may be present after the procedure and may need to have these addressed at a different encounter  - injury to the ureter is the most common intra-operative risk, it may result in an open procedure to correct the  defect  - infection and bleeding are also risks  - explained the risks of general anesthesia, such as: MI, CVA, paralysis, coma and/or death.  - advised to contact our office or seek treatment in the ED if becomes febrile or pain/ vomiting are difficult control in order to arrange for emergent/urgent intervention  2. Left renal stone  - see above  3. Left hydronephrosis  - obtain RUS to ensure the hydronephrosis has resolved once intervention has completed  3. Gross hematuria  - UA today demonstrates 11-30 RBC's.    - continue to monitor the patient's UA after the treatment/passage of the stone to ensure the hematuria has resolved  - if hematuria persists, we will pursue a hematuria workup with CT Urogram and cystoscopy if appropriate.    Return for left URS/LL/ureteral stent placement.  These notes generated with voice recognition software. I apologize for typographical errors.  Zara Council, [facility] [address] [place], [place] [address] [phone]

## 2016-05-25 NOTE — Anesthesia Preprocedure Evaluation (Signed)
Anesthesia Evaluation  Patient identified by MRN, date of birth, ID band Patient awake    Reviewed: Allergy & Precautions, NPO status , Patient's Chart, lab work & pertinent test results  Airway Mallampati: II  TM Distance: >3 FB     Dental  (+) Chipped   Pulmonary neg pulmonary ROS,    Pulmonary exam normal        Cardiovascular Normal cardiovascular exam     Neuro/Psych negative neurological ROS     GI/Hepatic Neg liver ROS,   Endo/Other  negative endocrine ROS  Renal/GU Renal diseasestones     Musculoskeletal negative musculoskeletal ROS (+)   Abdominal Normal abdominal exam  (+)   Peds  Hematology negative hematology ROS (+)   Anesthesia Other Findings   Reproductive/Obstetrics                             Anesthesia Physical Anesthesia Plan  ASA: III  Anesthesia Plan: General   Post-op Pain Management:    Induction: Intravenous  Airway Management Planned: Oral ETT  Additional Equipment:   Intra-op Plan:   Post-operative Plan: Extubation in OR  Informed Consent: I have reviewed the patients History and Physical, chart, labs and discussed the procedure including the risks, benefits and alternatives for the proposed anesthesia with the patient or authorized representative who has indicated his/her understanding and acceptance.     Plan Discussed with: CRNA and Surgeon  Anesthesia Plan Comments:         Anesthesia Quick Evaluation

## 2016-05-25 NOTE — Interval H&P Note (Signed)
History and Physical Interval Note:  05/25/2016 11:44 AM  Nathan Flynn  has presented today for surgery, with the diagnosis of LEFT UPJ STONE,RANAL STONE  The various methods of treatment have been discussed with the patient and family. After consideration of risks, benefits and other options for treatment, the patient has consented to  Procedure(s): URETEROSCOPY WITH HOLMIUM LASER LITHOTRIPSY (Left) CYSTOSCOPY WITH STENT PLACEMENT (Left) as a surgical intervention .  The patient's history has been reviewed, patient examined, no change in status, stable for surgery.  I have reviewed the patient's chart and labs.  Questions were answered to the patient's satisfaction.    RRR CTAB  Patient continues to have left flank pain, intermittently severe nausea. In the preoperative area, we did discuss option of continued medical expulsive therapy assess the risks and benefits of surgery to especially given his habitus. He would like to proceed with surgery as planned. All his questions are answered.  Vanna ScotlandAshley Xianna Siverling

## 2016-05-25 NOTE — Anesthesia Procedure Notes (Signed)
Procedure Name: Intubation Performed by: Casey Burkitt Pre-anesthesia Checklist: Patient identified, Patient being monitored, Timeout performed, Emergency Drugs available and Suction available Patient Re-evaluated:Patient Re-evaluated prior to inductionOxygen Delivery Method: Circle system utilized Preoxygenation: Pre-oxygenation with 100% oxygen Intubation Type: IV induction Ventilation: Mask ventilation without difficulty, Oral airway inserted - appropriate to patient size and Two handed mask ventilation required Laryngoscope Size: Glidescope and 4 Grade View: Grade II Tube type: Oral Tube size: 7.5 mm Number of attempts: 1 Airway Equipment and Method: Stylet and Video-laryngoscopy Placement Confirmation: ETT inserted through vocal cords under direct vision,  positive ETCO2 and breath sounds checked- equal and bilateral Secured at: 23 cm Tube secured with: Tape Dental Injury: Teeth and Oropharynx as per pre-operative assessment  Future Recommendations: Recommend- induction with short-acting agent, and alternative techniques readily available

## 2016-05-26 ENCOUNTER — Other Ambulatory Visit: Payer: Self-pay | Admitting: Radiology

## 2016-05-26 DIAGNOSIS — N200 Calculus of kidney: Secondary | ICD-10-CM

## 2016-05-26 NOTE — Telephone Encounter (Signed)
Carollee HerterShannon helped me get this approved by doing the peer to peer for me today.  They are schd his CT scan   [facility]

## 2016-05-26 NOTE — Telephone Encounter (Signed)
This has to be done because it will possibly prevent him from needing a second procedure. Please request a peer to peer if they deny again.  Vanna ScotlandAshley Capria Cartaya, MD

## 2016-05-26 NOTE — Telephone Encounter (Signed)
Because he just had a ct scan done on the 28th of Feburary they will not approve another yet. It has not been 30 days since his last scan. They keep deny it. Its not going to happen before his surgery on Monday if I can't get it approved. I will keep trying.  Nathan Flynn

## 2016-05-29 ENCOUNTER — Ambulatory Visit
Admission: RE | Admit: 2016-05-29 | Discharge: 2016-05-29 | Disposition: A | Discharge status: Home / Self Care | Payer: BLUE CROSS/BLUE SHIELD | Source: Ambulatory Visit | Attending: Urology | Admitting: Urology

## 2016-05-29 DIAGNOSIS — K760 Fatty (change of) liver, not elsewhere classified: Secondary | ICD-10-CM | POA: Diagnosis not present

## 2016-05-29 DIAGNOSIS — N200 Calculus of kidney: Secondary | ICD-10-CM | POA: Insufficient documentation

## 2016-05-31 MED ORDER — SODIUM CHLORIDE 0.9 % IV SOLN
2.0000 g | INTRAVENOUS | Status: AC
  Filled 2016-05-31: qty 2

## 2016-06-01 ENCOUNTER — Ambulatory Visit
Admission: RE | Admit: 2016-06-01 | Discharge: 2016-06-01 | Disposition: A | Discharge status: Home / Self Care | Payer: BLUE CROSS/BLUE SHIELD | Source: Ambulatory Visit | Attending: Urology | Admitting: Urology

## 2016-06-01 ENCOUNTER — Encounter: Payer: Self-pay | Admitting: *Deleted

## 2016-06-01 ENCOUNTER — Encounter
Admission: RE | Disposition: A | Discharge status: Home / Self Care | Payer: Self-pay | Source: Ambulatory Visit | Attending: Urology

## 2016-06-01 ENCOUNTER — Ambulatory Visit: Payer: BLUE CROSS/BLUE SHIELD

## 2016-06-01 ENCOUNTER — Ambulatory Visit: Payer: BLUE CROSS/BLUE SHIELD | Admitting: Certified Registered Nurse Anesthetist

## 2016-06-01 DIAGNOSIS — Z87442 Personal history of urinary calculi: Secondary | ICD-10-CM | POA: Insufficient documentation

## 2016-06-01 DIAGNOSIS — Z79891 Long term (current) use of opiate analgesic: Secondary | ICD-10-CM | POA: Insufficient documentation

## 2016-06-01 DIAGNOSIS — R310 Gross hematuria: Secondary | ICD-10-CM | POA: Insufficient documentation

## 2016-06-01 DIAGNOSIS — N132 Hydronephrosis with renal and ureteral calculous obstruction: Secondary | ICD-10-CM | POA: Diagnosis present

## 2016-06-01 DIAGNOSIS — Z79899 Other long term (current) drug therapy: Secondary | ICD-10-CM | POA: Insufficient documentation

## 2016-06-01 DIAGNOSIS — Z791 Long term (current) use of non-steroidal anti-inflammatories (NSAID): Secondary | ICD-10-CM | POA: Diagnosis not present

## 2016-06-01 DIAGNOSIS — N200 Calculus of kidney: Secondary | ICD-10-CM | POA: Diagnosis not present

## 2016-06-01 DIAGNOSIS — Z419 Encounter for procedure for purposes other than remedying health state, unspecified: Secondary | ICD-10-CM

## 2016-06-01 DIAGNOSIS — Z883 Allergy status to other anti-infective agents status: Secondary | ICD-10-CM | POA: Diagnosis not present

## 2016-06-01 HISTORY — PX: URETEROSCOPY WITH HOLMIUM LASER LITHOTRIPSY: SHX6645

## 2016-06-01 HISTORY — PX: CYSTOSCOPY W/ URETERAL STENT PLACEMENT: SHX1429

## 2016-06-01 SURGERY — URETEROSCOPY, WITH LITHOTRIPSY USING HOLMIUM LASER
Anesthesia: General | Laterality: Left

## 2016-06-01 MED ORDER — IOTHALAMATE MEGLUMINE 43 % IV SOLN
INTRAVENOUS | Status: DC | PRN
  Administered 2016-06-01: 25 mL

## 2016-06-01 MED ORDER — FENTANYL CITRATE (PF) 100 MCG/2ML IJ SOLN
INTRAMUSCULAR | Status: AC
  Filled 2016-06-01: qty 2

## 2016-06-01 MED ORDER — DEXAMETHASONE SODIUM PHOSPHATE 10 MG/ML IJ SOLN
INTRAMUSCULAR | Status: DC | PRN
  Administered 2016-06-01: 10 mg via INTRAVENOUS

## 2016-06-01 MED ORDER — SUGAMMADEX SODIUM 500 MG/5ML IV SOLN
INTRAVENOUS | Status: AC
  Filled 2016-06-01: qty 5

## 2016-06-01 MED ORDER — GENTAMICIN SULFATE 40 MG/ML IJ SOLN
670.0000 mg | Freq: Once | INTRAVENOUS | Status: AC
  Administered 2016-06-01: 670 mg via INTRAVENOUS
  Filled 2016-06-01: qty 16.75

## 2016-06-01 MED ORDER — FENTANYL CITRATE (PF) 100 MCG/2ML IJ SOLN
INTRAMUSCULAR | Status: DC | PRN
  Administered 2016-06-01 (×3): 50 ug via INTRAVENOUS

## 2016-06-01 MED ORDER — FENTANYL CITRATE (PF) 100 MCG/2ML IJ SOLN
25.0000 ug | INTRAMUSCULAR | Status: DC | PRN
  Administered 2016-06-01 (×4): 25 ug via INTRAVENOUS

## 2016-06-01 MED ORDER — SUCCINYLCHOLINE CHLORIDE 20 MG/ML IJ SOLN
INTRAMUSCULAR | Status: AC
  Filled 2016-06-01: qty 1

## 2016-06-01 MED ORDER — PROPOFOL 10 MG/ML IV BOLUS
INTRAVENOUS | Status: DC | PRN
  Administered 2016-06-01: 250 mg via INTRAVENOUS
  Administered 2016-06-01 (×2): 50 mg via INTRAVENOUS

## 2016-06-01 MED ORDER — SUCCINYLCHOLINE CHLORIDE 20 MG/ML IJ SOLN
INTRAMUSCULAR | Status: DC | PRN
  Administered 2016-06-01: 180 mg via INTRAVENOUS

## 2016-06-01 MED ORDER — HYDROMORPHONE HCL 2 MG PO TABS: 2.0000 mg | ORAL_TABLET | Freq: Two times a day (BID) | ORAL | 0 refills | Status: DC | PRN

## 2016-06-01 MED ORDER — ROCURONIUM BROMIDE 100 MG/10ML IV SOLN
INTRAVENOUS | Status: DC | PRN
  Administered 2016-06-01 (×2): 20 mg via INTRAVENOUS
  Administered 2016-06-01: 10 mg via INTRAVENOUS

## 2016-06-01 MED ORDER — ROCURONIUM BROMIDE 50 MG/5ML IV SOLN
INTRAVENOUS | Status: AC
  Filled 2016-06-01: qty 1

## 2016-06-01 MED ORDER — PROPOFOL 10 MG/ML IV BOLUS
INTRAVENOUS | Status: AC
  Filled 2016-06-01: qty 20

## 2016-06-01 MED ORDER — FENTANYL CITRATE (PF) 100 MCG/2ML IJ SOLN
INTRAMUSCULAR | Status: AC
  Administered 2016-06-01: 25 ug via INTRAVENOUS
  Filled 2016-06-01: qty 2

## 2016-06-01 MED ORDER — PHENYLEPHRINE HCL 10 MG/ML IJ SOLN
INTRAMUSCULAR | Status: AC
  Filled 2016-06-01: qty 1

## 2016-06-01 MED ORDER — OXYCODONE HCL 5 MG/5ML PO SOLN: 5.0000 mg | Freq: Once | ORAL | Status: DC | PRN

## 2016-06-01 MED ORDER — PROMETHAZINE HCL 25 MG/ML IJ SOLN: 6.2500 mg | INTRAMUSCULAR | Status: DC | PRN

## 2016-06-01 MED ORDER — ONDANSETRON HCL 4 MG/2ML IJ SOLN
INTRAMUSCULAR | Status: AC
  Filled 2016-06-01: qty 2

## 2016-06-01 MED ORDER — ONDANSETRON HCL 4 MG/2ML IJ SOLN
INTRAMUSCULAR | Status: DC | PRN
  Administered 2016-06-01: 4 mg via INTRAVENOUS

## 2016-06-01 MED ORDER — AMPICILLIN-SULBACTAM SODIUM 3 (2-1) G IJ SOLR
INTRAMUSCULAR | Status: DC | PRN
  Administered 2016-06-01: 2000 mg via INTRAVENOUS

## 2016-06-01 MED ORDER — MIDAZOLAM HCL 2 MG/2ML IJ SOLN
INTRAMUSCULAR | Status: AC
  Filled 2016-06-01: qty 2

## 2016-06-01 MED ORDER — SODIUM CHLORIDE 0.9 % IR SOLN
Status: DC | PRN
  Administered 2016-06-01: 1200 mL via INTRAVESICAL
  Administered 2016-06-01: 500 mL via INTRAVESICAL

## 2016-06-01 MED ORDER — LIDOCAINE HCL (PF) 2 % IJ SOLN
INTRAMUSCULAR | Status: AC
  Filled 2016-06-01: qty 2

## 2016-06-01 MED ORDER — EPHEDRINE SULFATE 50 MG/ML IJ SOLN
INTRAMUSCULAR | Status: AC
  Filled 2016-06-01: qty 1

## 2016-06-01 MED ORDER — SUGAMMADEX SODIUM 200 MG/2ML IV SOLN
INTRAVENOUS | Status: DC | PRN
  Administered 2016-06-01: 450 mg via INTRAVENOUS

## 2016-06-01 MED ORDER — LIDOCAINE HCL (CARDIAC) 20 MG/ML IV SOLN
INTRAVENOUS | Status: DC | PRN
  Administered 2016-06-01: 100 mg via INTRAVENOUS

## 2016-06-01 MED ORDER — DEXAMETHASONE SODIUM PHOSPHATE 10 MG/ML IJ SOLN
INTRAMUSCULAR | Status: AC
  Filled 2016-06-01: qty 1

## 2016-06-01 MED ORDER — MEPERIDINE HCL 50 MG/ML IJ SOLN: 6.2500 mg | INTRAMUSCULAR | Status: DC | PRN

## 2016-06-01 MED ORDER — LACTATED RINGERS IV SOLN
INTRAVENOUS | Status: DC
  Administered 2016-06-01: 08:00:00 via INTRAVENOUS

## 2016-06-01 MED ORDER — OXYCODONE HCL 5 MG PO TABS: 5.0000 mg | ORAL_TABLET | Freq: Once | ORAL | Status: DC | PRN

## 2016-06-01 MED ORDER — MIDAZOLAM HCL 2 MG/2ML IJ SOLN
INTRAMUSCULAR | Status: DC | PRN
  Administered 2016-06-01 (×2): 1 mg via INTRAVENOUS

## 2016-06-01 SURGICAL SUPPLY — 36 items
BAG DRAIN CYSTO-URO LG1000N (MISCELLANEOUS) ×3 IMPLANT
BASKET STONE 1.9X4X120X12 (MISCELLANEOUS) IMPLANT
BASKET ZERO TIP 1.9FR (BASKET) ×3 IMPLANT
BRUSH SCRUB EZ 1% IODOPHOR (MISCELLANEOUS) ×3 IMPLANT
CATH URETL 5X70 OPEN END (CATHETERS) IMPLANT
CNTNR SPEC 2.5X3XGRAD LEK (MISCELLANEOUS) ×1
CONRAY 43 FOR UROLOGY 50M (MISCELLANEOUS) ×3 IMPLANT
CONT SPEC 4OZ STER OR WHT (MISCELLANEOUS) ×2
CONTAINER SPEC 2.5X3XGRAD LEK (MISCELLANEOUS) ×1 IMPLANT
DRAPE C-ARM XRAY 36X54 (DRAPES) ×3 IMPLANT
DRAPE UTILITY 15X26 TOWEL STRL (DRAPES) ×3 IMPLANT
FIBER LASER LITHO 273 (Laser) ×3 IMPLANT
GLOVE BIO SURGEON STRL SZ 6.5 (GLOVE) ×4 IMPLANT
GLOVE BIO SURGEONS STRL SZ 6.5 (GLOVE) ×2
GOWN STRL REUS W/ TWL LRG LVL3 (GOWN DISPOSABLE) ×2 IMPLANT
GOWN STRL REUS W/TWL LRG LVL3 (GOWN DISPOSABLE) ×4
GUIDEWIRE GREEN .038 145CM (MISCELLANEOUS) ×3 IMPLANT
INFUSOR MANOMETER BAG 3000ML (MISCELLANEOUS) ×3 IMPLANT
INTRODUCER DILATOR DOUBLE (INTRODUCER) ×3 IMPLANT
KIT RM TURNOVER CYSTO AR (KITS) ×3 IMPLANT
PACK CYSTO AR (MISCELLANEOUS) ×3 IMPLANT
SCRUB POVIDONE IODINE 4 OZ (MISCELLANEOUS) IMPLANT
SENSORWIRE 0.038 NOT ANGLED (WIRE) ×3
SET CYSTO W/LG BORE CLAMP LF (SET/KITS/TRAYS/PACK) ×3 IMPLANT
SHEATH URETERAL 12FRX35CM (MISCELLANEOUS) ×3 IMPLANT
SNAP KOVER IMPLANT
SOL .9 NS 3000ML IRR  AL (IV SOLUTION) ×2
SOL .9 NS 3000ML IRR UROMATIC (IV SOLUTION) ×1 IMPLANT
STENT URET 6FRX24 CONTOUR (STENTS) IMPLANT
STENT URET 6FRX26 CONTOUR (STENTS) IMPLANT
STENT URET 6FRX28 CONTOUR (STENTS) ×3 IMPLANT
STRAP SAFETY BODY (MISCELLANEOUS) ×6 IMPLANT
SURGILUBE 2OZ TUBE FLIPTOP (MISCELLANEOUS) ×3 IMPLANT
SYRINGE IRR TOOMEY STRL 70CC (SYRINGE) IMPLANT
WATER STERILE IRR 1000ML POUR (IV SOLUTION) ×3 IMPLANT
WIRE SENSOR 0.038 NOT ANGLED (WIRE) ×1 IMPLANT

## 2016-06-01 NOTE — H&P (View-Only) (Signed)
  05/21/2016 4:35 PM   Nathan Flynn 07/20/1985 [id number]  Referring provider: No referring provider defined for this encounter.  Chief Complaint  Patient presents with  . New Patient (Initial Visit)    kidney stone referred by ER     HPI: Patient is a 30 year old Caucasian male who presents/is referred by [facility]'s ED for nephrolithiasis.  Patient states the onset of the pain was 2 weeks ago.   It was sharp.  It lasted for a few hours  The pain was located left flank and radiated to left abdomen.    The pain was a 10/10.  Narcotics made the pain better.   Nothing made the pain worse.  He did have gross hematuria, nausea and vomiting.  In the ED , hydromorphone, ketorolac and ondansetrone.   His UA demonstrated TNTC RBC's.   Serum creatinine was 1.18.  WBC count was 8.6.    CT Renal stone study performed on 05/09/2016 noted mild bilateral nephrolithiasis. 7 mm stone at the left UPJ causing low-grade obstruction.  I have independently reviewed the films.    Today, he is having left flank pain.  He is unable to get comfortable in the room.  He denies gross hematuria and dysuria.  He denies fevers, chills and vomiting.  He is having nausea.  UA today is positive 11-30 RBC's.    He does not have a prior history of stones.      PMH: Past Medical History:  Diagnosis Date  . Kidney stone     Surgical History: History reviewed. No pertinent surgical history.  Home Medications:  Allergies as of 05/21/2016      Reactions   Septra [sulfamethoxazole-trimethoprim]    "twin was allergic so told me not to take"      Medication List       Accurate as of 05/21/16  4:35 PM. Always use your most recent med list.          acetaminophen 500 MG chewable tablet Commonly known as:  TYLENOL Chew 500 mg by mouth every 6 (six) hours as needed for pain.   HYDROmorphone 2 MG tablet Commonly known as:  DILAUDID Take 1 tablet (2 mg total) by mouth every 12 (twelve) hours as  needed for severe pain.   ibuprofen 800 MG tablet Commonly known as:  ADVIL,MOTRIN Take 1 tablet (800 mg total) by mouth every 8 (eight) hours as needed.   ondansetron 4 MG tablet Commonly known as:  ZOFRAN Take 1 tablet (4 mg total) by mouth every 8 (eight) hours as needed for nausea or vomiting.   tamsulosin 0.4 MG Caps capsule Commonly known as:  FLOMAX Take 1 capsule (0.4 mg total) by mouth daily.       Allergies:  Allergies  Allergen Reactions  . Septra [Sulfamethoxazole-Trimethoprim]     "twin was allergic so told me not to take"    Family History: Family History  Problem Relation Age of Onset  . Prostate cancer Neg Hx   . Kidney cancer Neg Hx   . Bladder Cancer Neg Hx     Social History:  reports that he has never smoked. He has never used smokeless tobacco. He reports that he does not drink alcohol or use drugs.  ROS: UROLOGY Frequent Urination?: Yes Hard to postpone urination?: No Burning/pain with urination?: Yes Get up at night to urinate?: No Leakage of urine?: No Urine stream starts and stops?: No Trouble starting stream?: No Do you have to strain   to urinate?: No Blood in urine?: Yes Urinary tract infection?: No Sexually transmitted disease?: No Injury to kidneys or bladder?: No Painful intercourse?: No Weak stream?: No Erection problems?: No Penile pain?: No  Gastrointestinal Nausea?: Yes Vomiting?: Yes Indigestion/heartburn?: No Diarrhea?: No Constipation?: Yes  Constitutional Fever: No Night sweats?: No Weight loss?: No Fatigue?: Yes  Skin Skin rash/lesions?: No Itching?: No  Eyes Blurred vision?: No Double vision?: No  Ears/Nose/Throat Sore throat?: No Sinus problems?: Yes  Hematologic/Lymphatic Swollen glands?: No Easy bruising?: No  Cardiovascular Leg swelling?: Yes Chest pain?: No  Respiratory Cough?: No Shortness of breath?: Yes  Endocrine Excessive thirst?: No  Musculoskeletal Back pain?: Yes Joint  pain?: No  Neurological Headaches?: Yes Dizziness?: Yes  Psychologic Depression?: No Anxiety?: No  Physical Exam: BP (!) 150/82   Pulse (!) 109   Ht 6' (1.829 m)   Wt (!) 483 lb 3.2 oz (219.2 kg)   BMI 65.53 kg/m   Constitutional: Well nourished. Alert and oriented, No acute distress. HEENT: [place] AT, moist mucus membranes. Trachea midline, no masses. Cardiovascular: No clubbing, cyanosis, or edema. Respiratory: Normal respiratory effort, no increased work of breathing. GI: Abdomen is soft, non tender, non distended, no abdominal masses. Liver and spleen not palpable.  No hernias appreciated.  Stool sample for occult testing is not indicated.   GU: No CVA tenderness.  No bladder fullness or masses.   Skin: No rashes, bruises or suspicious lesions. Lymph: No cervical or inguinal adenopathy. Neurologic: Grossly intact, no focal deficits, moving all 4 extremities. Psychiatric: Normal mood and affect.  Laboratory Data: Lab Results  Component Value Date   WBC 8.6 05/09/2016   HGB 14.1 05/09/2016   HCT 41.3 05/09/2016   MCV 89.9 05/09/2016   PLT 145 (L) 05/09/2016    Lab Results  Component Value Date   CREATININE 1.18 05/09/2016   Urinalysis 11-30 RBC's.  See EPIC.   Pertinent Imaging: CLINICAL DATA:  Left flank pain radiating to left lower quadrant. Dark urine. Nausea.  EXAM: CT ABDOMEN AND PELVIS WITHOUT CONTRAST  TECHNIQUE: Multidetector CT imaging of the abdomen and pelvis was performed following the standard protocol without IV contrast.  COMPARISON:  None.  FINDINGS: Lower chest: Lung bases are normal.  Hepatobiliary: Within normal.  Pancreas: Within normal.  Spleen: Within normal.  Adrenals/Urinary Tract: Adrenal glands are normal. Kidneys are normal in size with mild bilateral nephrolithiasis. Mild dilatation of the left intrarenal collecting system as there is a 7 mm stone at the UPJ causing this low-grade obstruction. Mild stranding of  the left perinephric fat. Remainder of the left ureter is normal. The right ureter is normal. Bladder is unremarkable.  Stomach/Bowel: Stomach and small bowel are within normal. Appendix is normal. Colon is within normal.  Vascular/Lymphatic: Within normal.  Reproductive: Within normal.  Other: None.  Musculoskeletal: Mild degenerate change of the spine. 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 Mild degenerate change of the hips.  IMPRESSION: Mild bilateral nephrolithiasis. 7 mm stone at the left UPJ causing low-grade obstruction.   Electronically Signed   By: Daniel  Boyle M.D.   On: 05/09/2016 21:29   Assessment & Plan:   Patient will undergo left ureteroscopy with left laser lithotripsy with left ureteral stent placement for definitive treatment of a left 7 mm UPJ and left renal stone.    1. Left UPJ stone  - discussed MET vs ESWL vs URS/LL/ureteral stent placement and the success rate of each - recommend URS/LL/ureteral stent placement as it would give   him the best chance of becoming stone free due to size of the stone and the skin to stone distance  - schedule LEFT ureteroscopy with laser lithotripsy and ureteral stent placement  - explained to the patient how the procedure is performed and the risks involved  - informed patient that they will have a stent placed during the procedure and will remain in place after the procedure for a short time.   - stent may be removed in the office with a cystoscope or patient may be instructed to remove the stent themselves by the string  - described "stent pain" as feelings of needing to urinate/overactive bladder and a warm, tingling sensation to intense pain in the affected flank  - residual stones within the kidney or ureter may be present after the procedure and may need to have these addressed at a different encounter  - injury to the ureter is the most common intra-operative risk, it may result in an open procedure to correct the  defect  - infection and bleeding are also risks  - explained the risks of general anesthesia, such as: MI, CVA, paralysis, coma and/or death.  - advised to contact our office or seek treatment in the ED if becomes febrile or pain/ vomiting are difficult control in order to arrange for emergent/urgent intervention  2. Left renal stone  - see above  3. Left hydronephrosis  - obtain RUS to ensure the hydronephrosis has resolved once intervention has completed  3. Gross hematuria  - UA today demonstrates 11-30 RBC's.    - continue to monitor the patient's UA after the treatment/passage of the stone to ensure the hematuria has resolved  - if hematuria persists, we will pursue a hematuria workup with CT Urogram and cystoscopy if appropriate.    Return for left URS/LL/ureteral stent placement.  These notes generated with voice recognition software. I apologize for typographical errors.  Felis Quillin, PA-C  [facility] [address], [address] [place], [place] [address] [phone]  

## 2016-06-01 NOTE — Anesthesia Preprocedure Evaluation (Addendum)
Anesthesia Evaluation  Patient identified by MRN, date of birth, ID band Patient awake    Reviewed: Allergy & Precautions, NPO status , Patient's Chart, lab work & pertinent test results  History of Anesthesia Complications Negative for: history of anesthetic complications  Airway Mallampati: I  TM Distance: >3 FB Neck ROM: Full    Dental  (+) Poor Dentition   Pulmonary neg pulmonary ROS, neg sleep apnea, neg COPD,    breath sounds clear to auscultation- rhonchi (-) wheezing      Cardiovascular Exercise Tolerance: Good (-) hypertension(-) CAD and (-) Past MI  Rhythm:Regular Rate:Normal - Systolic murmurs and - Diastolic murmurs    Neuro/Psych negative neurological ROS  negative psych ROS   GI/Hepatic negative GI ROS, Neg liver ROS,   Endo/Other  neg diabetesMorbid obesity  Renal/GU Renal disease: nephrolithiasis.     Musculoskeletal negative musculoskeletal ROS (+)   Abdominal (+) + obese,   Peds  Hematology negative hematology ROS (+)   Anesthesia Other Findings   Reproductive/Obstetrics                             Anesthesia Physical Anesthesia Plan  ASA: II  Anesthesia Plan: General   Post-op Pain Management:    Induction: Intravenous  Airway Management Planned: Oral ETT and Video Laryngoscope Planned  Additional Equipment:   Intra-op Plan:   Post-operative Plan: Extubation in OR  Informed Consent: I have reviewed the patients History and Physical, chart, labs and discussed the procedure including the risks, benefits and alternatives for the proposed anesthesia with the patient or authorized representative who has indicated his/her understanding and acceptance.   Dental advisory given  Plan Discussed with: CRNA and Anesthesiologist  Anesthesia Plan Comments:        Anesthesia Quick Evaluation

## 2016-06-01 NOTE — Transfer of Care (Signed)
Immediate Anesthesia Transfer of Care Note  Patient: Nathan SarksJason Michael Flynn  Procedure(s) Performed: Procedure(s): URETEROSCOPY WITH HOLMIUM LASER LITHOTRIPSY (Left) CYSTOSCOPY WITH STENT  REPLACMENT (Left)  Patient Location: PACU  Anesthesia Type:General  Level of Consciousness: awake  Airway & Oxygen Therapy: Patient Spontanous Breathing and Patient connected to nasal cannula oxygen  Post-op Assessment: Report given to RN and Post -op Vital signs reviewed and stable  Post vital signs: Reviewed and stable  Last Vitals:  Vitals:   06/01/16 0743  BP: (!) 157/86  Pulse: 80  Resp: 20  Temp: 36.8 C    Last Pain:  Vitals:   06/01/16 0743  TempSrc: Oral  PainSc: 2          Complications: No apparent anesthesia complications

## 2016-06-01 NOTE — Anesthesia Post-op Follow-up Note (Cosign Needed)
Anesthesia QCDR form completed.        

## 2016-06-01 NOTE — Interval H&P Note (Signed)
History and Physical Interval Note:  06/01/2016 8:27 AM  Nathan SarksJason Michael Bissette  has presented today for surgery, with the diagnosis of left ureteral stone  The various methods of treatment have been discussed with the patient and family. After consideration of risks, benefits and other options for treatment, the patient has consented to  Procedure(s): URETEROSCOPY WITH HOLMIUM LASER LITHOTRIPSY (Left) CYSTOSCOPY WITH STENT REPLACEMENT (Left) as a surgical intervention .  The patient's history has been reviewed, patient examined, no change in status, stable for surgery.  I have reviewed the patient's chart and labs.  Questions were answered to the patient's satisfaction.    Returns today for a staged ureteroscopy. Stent previously placed. Interval CT scan shows stone pushed into lower pole of the kidney.\  RRR CTAB   Vanna ScotlandAshley Ronon Ferger

## 2016-06-01 NOTE — Discharge Instructions (Signed)
You have a ureteral stent in place.  This is a tube that extends from your kidney to your bladder.  This may cause urinary bleeding, burning with urination, and urinary frequency.  Please call our office or present to the ED if you develop fevers >101 or pain which is not able to be controlled with oral pain medications.  You may be given either Flomax and/ or ditropan to help with bladder spasms and stent pain in addition to pain medications.    [facility] [address], [address] [place], [place] [address] [phone]   AMBULATORY SURGERY  DISCHARGE INSTRUCTIONS   1) The drugs that you were given will stay in your system until tomorrow so for the next 24 hours you should not:  A) Drive an automobile B) Make any legal decisions C) Drink any alcoholic beverage   2) You may resume regular meals tomorrow.  Today it is better to start with liquids and gradually work up to solid foods.  You may eat anything you prefer, but it is better to start with liquids, then soup and crackers, and gradually work up to solid foods.   3) Please notify your doctor immediately if you have any unusual bleeding, trouble breathing, redness and pain at the surgery site, drainage, fever, or pain not relieved by medication.   4) Additional Instructions:

## 2016-06-01 NOTE — Anesthesia Postprocedure Evaluation (Signed)
Anesthesia Post Note  Patient: Nathan Flynn  Procedure(s) Performed: Procedure(s) (LRB): URETEROSCOPY WITH HOLMIUM LASER LITHOTRIPSY (Left) CYSTOSCOPY WITH STENT  REPLACMENT (Left)  Patient location during evaluation: PACU Anesthesia Type: General Level of consciousness: awake and alert and oriented Pain management: pain level controlled Vital Signs Assessment: post-procedure vital signs reviewed and stable Respiratory status: spontaneous breathing, nonlabored ventilation and respiratory function stable Cardiovascular status: blood pressure returned to baseline and stable Postop Assessment: no signs of nausea or vomiting Anesthetic complications: no     Last Vitals:  Vitals:   06/01/16 1221 06/01/16 1226  BP: 117/61   Pulse: 73 78  Resp: 14 12  Temp:      Last Pain:  Vitals:   06/01/16 1226  TempSrc:   PainSc: 3                  Montell Leopard

## 2016-06-01 NOTE — Op Note (Signed)
Date of procedure: 06/01/16  Preoperative diagnosis:  1. Right kidney stones   Postoperative diagnosis:  1. Right kidney stones   Procedure: 1. Right ureteroscopy 2. Laser lithotripsy 3. Right ureteral stent exchange 4. Basket extraction of right stone fragment 5. Right retrograde pyelogram  Surgeon: Vanna ScotlandAshley Birdena Kingma, MD  Anesthesia: General  Complications: None  Intraoperative findings: Narrowing in the proximal ureter where the stone up in previously lodged.  7 mm stone in the lower pole treated as well as a smaller nonobstructing stone.  EBL: minimal  Specimens: stone fragment  Drains: 6 x 28 Fr JJ ureteral stent on left  Indication: Nathan SarksJason Michael Flynn is a 31 y.o. patient with with an obstructing left proximal ureteral stone status post attempted ureteroscopy last week but unable to advance scope to the level of the stone. He returns today for a staged procedure.  After reviewing the management options for treatment, he elected to proceed with the above surgical procedure(s). We have discussed the potential benefits and risks of the procedure, side effects of the proposed treatment, the likelihood of the patient achieving the goals of the procedure, and any potential problems that might occur during the procedure or recuperation. Informed consent has been obtained.  Description of procedure:  The patient was taken to the operating room and general anesthesia was induced.  The patient was placed in the dorsal lithotomy position, prepped and draped in the usual sterile fashion, and preoperative antibiotics were administered. A preoperative time-out was performed.   A 21 French scope was advanced per urethra into the bladder. Attention was turned to left ureteral orifice from which a ureteral stent was seen emanating. The distal coil stent was grasped, brought to level the urethral meatus, then cannulated using a sensor wire up to level kidneys.  This point in time, the sternal  closure could be seen within the lower pole. A dual lumen introducer was then used within the distal ureter to introduce the Super Stiff wire. A ureteral access sheath, Adriana SimasCook 12/14 was then introduced up to level of the mid ureter meeting some resistance in the proximal ureter. The inner cannula was removed. An 8 French Leksell ureteroscope was then advanced to level of the proximal ureter were fairly significant narrowing was appreciated where the presumably the stone was previously lodged. At first I tried to advance the scope through under direct visualization meeting resistance. I then was successful in advancing the scope over a Super Stiff wire.  A retrograde pyelogram was performed. A roadmap of the upper tract collecting system. This was relatively complex with long infundibula. The scope was advanced down into the extreme lower pole with a 7 mm stone was identified. A 270  laser fiber was then brought in and using settings of 0.8 J and 15 Hz, the stone was fragmented into partly 5 or so smaller pieces. Each of these were then able to be extracted using a 1.9 JamaicaFrench nitinol basket. I also addressed the smaller 3 mm lower pole stone.  After the kidney was adequately cleared and each and every calyx was strictly visualized again, final retrograde pyelogram was performed for the purpose of stent placement. The scope then backed down the length of ureter inspecting along the way. The narrowing within the proximal ureter was stable and just large enough to accommodate the 8 French ureteroscope. There was no ureteral injury appreciated. The safety wire was then backloaded over a rigid cystoscope. A 6 x 28 French double-J ureteral stent was then placed under  fluoroscopic guidance up to level of the renal pelvis. The wire was partially drawn and U-shaped hook was noted overnight per pole calyx. A full coil was noted within the bladder. The bladder was then drained.  The patient was then cleaned and dried,  repositioned the supine position, reversed anesthesia, taken to the PACU in stable condition.  Plan: Patient will follow-up in 2 weeks for cystoscopy, stent removal. I will leave the stent in a little bit longer due to the narrowing of the proximal ureter to allow this to continue to dilate. I do have some concern for dominant of scar tissue at this location based on the appearance.  Vanna Scotland, M.D.

## 2016-06-01 NOTE — Anesthesia Procedure Notes (Signed)
Procedure Name: Intubation Date/Time: 06/01/2016 10:23 AM Performed by: Ginger CarneMICHELET, Luvia Orzechowski Pre-anesthesia Checklist: Patient identified, Emergency Drugs available, Suction available, Patient being monitored and Timeout performed Patient Re-evaluated:Patient Re-evaluated prior to inductionOxygen Delivery Method: Circle system utilized Preoxygenation: Pre-oxygenation with 100% oxygen Intubation Type: IV induction Ventilation: Oral airway inserted - appropriate to patient size and Two handed mask ventilation required Laryngoscope Size: Glidescope and 3 Grade View: Grade I Tube type: Oral Tube size: 7.5 mm Number of attempts: 1 Airway Equipment and Method: Stylet and Video-laryngoscopy Placement Confirmation: ETT inserted through vocal cords under direct vision,  positive ETCO2 and breath sounds checked- equal and bilateral Secured at: 25 cm Tube secured with: Tape Dental Injury: Teeth and Oropharynx as per pre-operative assessment  Difficulty Due To: Difficulty was anticipated Future Recommendations: Recommend- induction with short-acting agent, and alternative techniques readily available

## 2016-06-02 ENCOUNTER — Encounter: Payer: Self-pay | Admitting: Urology

## 2016-06-08 ENCOUNTER — Telehealth: Payer: Self-pay

## 2016-06-08 NOTE — Telephone Encounter (Signed)
Pt called c/o fatigue, mid section pain, and frequency urination. Reinforced with pt all these s/s are normal with a stent in place. Reinforced with pt to continue pushing fluids, take flomax, tylenol/ibuprofen and can apply heat. Pt voiced understanding.

## 2016-06-10 LAB — STONE ANALYSIS
CA PHOS CRY STONE QL IR: 5 %
Ca Oxalate,Dihydrate: 2 %
Ca Oxalate,Monohydr.: 93 %
Stone Weight KSTONE: 54.9 mg

## 2016-06-15 ENCOUNTER — Ambulatory Visit (INDEPENDENT_AMBULATORY_CARE_PROVIDER_SITE_OTHER): Payer: BLUE CROSS/BLUE SHIELD | Admitting: Urology

## 2016-06-15 ENCOUNTER — Encounter: Payer: Self-pay | Admitting: Urology

## 2016-06-15 VITALS — BP 151/85 | HR 108 | Ht 72.0 in | Wt >= 6400 oz

## 2016-06-15 DIAGNOSIS — N200 Calculus of kidney: Secondary | ICD-10-CM | POA: Diagnosis not present

## 2016-06-15 LAB — MICROSCOPIC EXAMINATION

## 2016-06-15 LAB — URINALYSIS, COMPLETE
Bilirubin, UA: NEGATIVE
Glucose, UA: NEGATIVE
Ketones, UA: NEGATIVE
Nitrite, UA: NEGATIVE
PH UA: 5 (ref 5.0–7.5)
Specific Gravity, UA: >1.03 — ABNORMAL HIGH (ref 1.005–1.030)
Urobilinogen, Ur: 0.2 mg/dL (ref 0.2–1.0)

## 2016-06-15 MED ORDER — CIPROFLOXACIN HCL 500 MG PO TABS
500.0000 mg | ORAL_TABLET | Freq: Once | ORAL | Status: AC
  Administered 2016-06-15: 500 mg via ORAL

## 2016-06-15 MED ORDER — LIDOCAINE HCL 2 % EX GEL
1.0000 "application " | Freq: Once | CUTANEOUS | Status: AC
  Administered 2016-06-15: 1 via URETHRAL

## 2016-06-15 NOTE — Progress Notes (Signed)
06/15/2016 6:11 AM   Nathan Flynn 1986-03-01 [id number]  Referring provider: No referring provider defined for this encounter.  CC: Follow up nephrolithiasis / stent pull  HPI:  1. Nephrolithiasis - s/p LEFT ureteroscopy for 7mm left ureteral stone 05/2016. CT at time also with RLP 8mm non-obstructing stone. Composition CaOx.  PMH sig for morbid obesity.  Today " Nathan Flynn " is seen for office cysto stent pull and f/u above.    PMH: Past Medical History:  Diagnosis Date  . Kidney stone     Surgical History: Past Surgical History:  Procedure Laterality Date  . CYSTOSCOPY W/ URETERAL STENT PLACEMENT Left 06/01/2016   Procedure: CYSTOSCOPY WITH STENT  REPLACMENT;  Surgeon: Vanna Scotland, MD;  Location: [facility];  Service: Urology;  Laterality: Left;  . CYSTOSCOPY WITH STENT PLACEMENT Left 05/25/2016   Procedure: CYSTOSCOPY WITH STENT PLACEMENT;  Surgeon: Vanna Scotland, MD;  Location: [facility];  Service: Urology;  Laterality: Left;  . none    . URETEROSCOPY Left 05/25/2016   Procedure: URETEROSCOPY;  Surgeon: Vanna Scotland, MD;  Location: [facility];  Service: Urology;  Laterality: Left;  . URETEROSCOPY WITH HOLMIUM LASER LITHOTRIPSY Left 06/01/2016   Procedure: URETEROSCOPY WITH HOLMIUM LASER LITHOTRIPSY;  Surgeon: Vanna Scotland, MD;  Location: [facility];  Service: Urology;  Laterality: Left;    Home Medications:  Allergies as of 06/15/2016      Reactions   Septra [sulfamethoxazole-trimethoprim]    "twin was allergic so told me not to take"      Medication List       Accurate as of 06/15/16  6:11 AM. Always use your most recent med list.          HYDROmorphone 2 MG tablet Commonly known as:  DILAUDID Take 1 tablet (2 mg total) by mouth every 12 (twelve) hours as needed for severe pain.   ibuprofen 800 MG tablet Commonly known as:  ADVIL,MOTRIN Take 800 mg by mouth every 8 (eight) hours as needed.   oxybutynin 5 MG tablet Commonly known as:  DITROPAN Take  1 tablet (5 mg total) by mouth every 8 (eight) hours as needed for bladder spasms.   promethazine 12.5 MG suppository Commonly known as:  PHENERGAN Place 1 suppository (12.5 mg total) rectally every 6 (six) hours as needed for nausea or vomiting.   tamsulosin 0.4 MG Caps capsule Commonly known as:  FLOMAX Take 1 capsule (0.4 mg total) by mouth daily.       Allergies:  Allergies  Allergen Reactions  . Septra [Sulfamethoxazole-Trimethoprim]     "twin was allergic so told me not to take"    Family History: Family History  Problem Relation Age of Onset  . Prostate cancer Neg Hx   . Kidney cancer Neg Hx   . Bladder Cancer Neg Hx     Social History:  reports that he has never smoked. He has never used smokeless tobacco. He reports that he does not drink alcohol or use drugs.    Review of Systems  Gastrointestinal (upper)  : Negative for upper GI symptoms  Gastrointestinal (lower) : Negative for lower GI symptoms  Constitutional : Negative for symptoms  Skin: Negative for skin symptoms  Eyes: Negative for eye symptoms  Ear/Nose/Throat : Negative for Ear/Nose/Throat symptoms  Hematologic/Lymphatic: Negative for Hematologic/Lymphatic symptoms  Cardiovascular : Negative for cardiovascular symptoms  Respiratory : Negative for respiratory symptoms  Endocrine: Negative for endocrine symptoms  Musculoskeletal: Negative for musculoskeletal symptoms  Neurological: Negative for neurological symptoms  Psychologic: Negative for psychiatric symptoms   Physical Exam: There were no vitals taken for this visit.  Constitutional:  Alert and oriented, No acute distress. Very pleasant.  HEENT: Cactus AT, moist mucus membranes.  Trachea midline, no masses. Cardiovascular: No clubbing, cyanosis, or edema. Respiratory: Normal respiratory effort, no increased work of breathing. GI: Abdomen is soft, nontender, nondistended, no abdominal masses GU: No CVA tenderness.   Buried penis.  Skin: No rashes, bruises or suspicious lesions. Lymph: No cervical or inguinal adenopathy. Neurologic: Grossly intact, no focal deficits, moving all 4 extremities. Psychiatric: Normal mood and affect.  Laboratory Data: Lab Results  Component Value Date   WBC 8.6 05/09/2016   HGB 14.1 05/09/2016   HCT 41.3 05/09/2016   MCV 89.9 05/09/2016   PLT 145 (L) 05/09/2016    Lab Results  Component Value Date   CREATININE 1.18 05/09/2016    No results found for: PSA  No results found for: TESTOSTERONE  No results found for: HGBA1C  Urinalysis    Component Value Date/Time   COLORURINE YELLOW (A) 05/09/2016 1858   APPEARANCEUR Clear 05/21/2016 1603   LABSPEC 1.026 05/09/2016 1858   PHURINE 5.0 05/09/2016 1858   GLUCOSEU Negative 05/21/2016 1603   HGBUR LARGE (A) 05/09/2016 1858   BILIRUBINUR Negative 05/21/2016 1603   KETONESUR NEGATIVE 05/09/2016 1858   PROTEINUR Negative 05/21/2016 1603   PROTEINUR 30 (A) 05/09/2016 1858   NITRITE Negative 05/21/2016 1603   NITRITE NEGATIVE 05/09/2016 1858   LEUKOCYTESUR Negative 05/21/2016 1603      06/15/16  CC:  Chief Complaint  Patient presents with  . Cysto Stent Removal    HPI:  Blood pressure (!) 151/85, pulse (!) 108, height 6' (1.829 m), weight (!) 209.6 kg (462 lb). NED. A&Ox3.   No respiratory distress   Abd soft, NT, ND Normal phallus with bilateral descended testicles  Cystoscopy Procedure Note  Patient identification was confirmed, informed consent was obtained, and patient was prepped using Betadine solution.  Lidocaine jelly was administered per urethral meatus.    Preoperative abx where received prior to procedure.     Pre-Procedure: - Inspection reveals a normal caliber ureteral meatus.  Procedure: The flexible cystoscope was introduced without difficulty - No urethral strictures/lesions are present. - Normal prostate  - Normal bladder neck - Bilateral ureteral orifices identified -  Bladder mucosa  reveals no ulcers, tumors, or lesions - No bladder stones - No trabeculation   - Left ureteral stent grasped, removed, inspected and intact.  Post-Procedure: - Patient tolerated the procedure well  Assessment/ Plan:   Pertinent Imaging: CT 05/2016 independtanly reviwed  Assessment & Plan:    1. Nephrolithiasis - left side now stone free. He does have signifiant Rt lower pole stone burden as well, though non-obstruting. General dietary recs discussed to lower risk of progression with emphasis on high citrate, low salt, high fluid volume.  RTC 1 year with KUB, renal US to monitor known Rt sided residual stone or sooner if acute colic arises.   Sebastian Ache, MD  [facility] [address], [address] [place], [place] [address] [phone]

## 2016-09-17 DIAGNOSIS — K760 Fatty (change of) liver, not elsewhere classified: Secondary | ICD-10-CM | POA: Insufficient documentation

## 2016-09-17 DIAGNOSIS — M545 Low back pain, unspecified: Secondary | ICD-10-CM | POA: Insufficient documentation

## 2016-09-17 DIAGNOSIS — G8929 Other chronic pain: Secondary | ICD-10-CM | POA: Insufficient documentation

## 2017-06-16 ENCOUNTER — Encounter: Payer: Self-pay | Admitting: Urology

## 2017-06-16 ENCOUNTER — Ambulatory Visit: Payer: BLUE CROSS/BLUE SHIELD | Admitting: Urology

## 2017-06-17 ENCOUNTER — Ambulatory Visit: Payer: BLUE CROSS/BLUE SHIELD

## 2017-11-23 ENCOUNTER — Other Ambulatory Visit: Payer: Self-pay

## 2017-11-23 ENCOUNTER — Encounter: Payer: Self-pay | Admitting: Emergency Medicine

## 2017-11-23 ENCOUNTER — Ambulatory Visit
Admission: EM | Admit: 2017-11-23 | Discharge: 2017-11-23 | Disposition: A | Discharge status: Home / Self Care | Payer: BLUE CROSS/BLUE SHIELD | Attending: Family Medicine | Admitting: Family Medicine

## 2017-11-23 DIAGNOSIS — Z87442 Personal history of urinary calculi: Secondary | ICD-10-CM | POA: Diagnosis not present

## 2017-11-23 DIAGNOSIS — R1011 Right upper quadrant pain: Secondary | ICD-10-CM | POA: Diagnosis not present

## 2017-11-23 LAB — URINALYSIS, COMPLETE (UACMP) WITH MICROSCOPIC
Bacteria, UA: NONE SEEN
Bilirubin Urine: NEGATIVE
Glucose, UA: NEGATIVE mg/dL
Leukocytes, UA: NEGATIVE
Nitrite: NEGATIVE
Protein, ur: 30 mg/dL — AB
Specific Gravity, Urine: 1.02 (ref 1.005–1.030)
WBC, UA: NONE SEEN WBC/hpf (ref 0–5)
pH: 7 (ref 5.0–8.0)

## 2017-11-23 LAB — CBC WITH DIFFERENTIAL/PLATELET
Basophils Absolute: 0 10*3/uL (ref 0–0.1)
Basophils Relative: 1 %
Eosinophils Absolute: 0.3 10*3/uL (ref 0–0.7)
Eosinophils Relative: 4 %
HCT: 41.1 % (ref 40.0–52.0)
Hemoglobin: 13.8 g/dL (ref 13.0–18.0)
Lymphocytes Relative: 20 %
Lymphs Abs: 1.5 10*3/uL (ref 1.0–3.6)
MCH: 30.6 pg (ref 26.0–34.0)
MCHC: 33.6 g/dL (ref 32.0–36.0)
MCV: 91.2 fL (ref 80.0–100.0)
Monocytes Absolute: 0.5 10*3/uL (ref 0.2–1.0)
Monocytes Relative: 6 %
Neutro Abs: 5 10*3/uL (ref 1.4–6.5)
Neutrophils Relative %: 69 %
Platelets: 153 10*3/uL (ref 150–440)
RBC: 4.5 MIL/uL (ref 4.40–5.90)
RDW: 13.6 % (ref 11.5–14.5)
WBC: 7.3 10*3/uL (ref 3.8–10.6)

## 2017-11-23 LAB — COMPREHENSIVE METABOLIC PANEL
ALT: 25 U/L (ref 0–44)
AST: 23 U/L (ref 15–41)
Albumin: 4.3 g/dL (ref 3.5–5.0)
Alkaline Phosphatase: 48 U/L (ref 38–126)
Anion gap: 10 (ref 5–15)
BUN: 17 mg/dL (ref 6–20)
CO2: 25 mmol/L (ref 22–32)
Calcium: 8.9 mg/dL (ref 8.9–10.3)
Chloride: 104 mmol/L (ref 98–111)
Creatinine, Ser: 0.93 mg/dL (ref 0.61–1.24)
GFR calc Af Amer: >60 mL/min (ref >60)
GFR calc non Af Amer: >60 mL/min (ref >60)
Glucose, Bld: 109 mg/dL — ABNORMAL HIGH (ref 70–99)
Potassium: 4.2 mmol/L (ref 3.5–5.1)
Sodium: 139 mmol/L (ref 135–145)
Total Bilirubin: 0.7 mg/dL (ref 0.3–1.2)
Total Protein: 8.1 g/dL (ref 6.5–8.1)

## 2017-11-23 MED ORDER — ONDANSETRON 8 MG PO TBDP
8.0000 mg | ORAL_TABLET | Freq: Once | ORAL | Status: AC
  Administered 2017-11-23: 8 mg via ORAL

## 2017-11-23 MED ORDER — HYDROCODONE-ACETAMINOPHEN 5-325 MG PO TABS: 1.0000 | ORAL_TABLET | Freq: Four times a day (QID) | ORAL | 0 refills | Status: DC | PRN

## 2017-11-23 NOTE — Discharge Instructions (Signed)
If your pain worsens  go immediately to the emergency room.  Strain all of your urine. Try taking Tylenol 500 mg combined with Ibuprofen 400 mg for pain every 6 hours for pain.

## 2017-11-23 NOTE — ED Triage Notes (Signed)
C/o RUQ abdominal pain moved to RLQ through out the day. He has had a lot of nausea, no vomiting or diarrhea. Pt reports that standing makes the pain better and sitting makes it worse.

## 2017-11-23 NOTE — ED Provider Notes (Signed)
[facility]    CSN: [id number] Arrival date & time: 11/23/17  1302     History   Chief Complaint Chief Complaint  Patient presents with  . Abdominal Pain    HPI Nathan Flynn is a 32 y.o. male.   HPI  -year-old male morbidly obese presents with right upper quadrant pain that began on Sunday.  He states that been a steady type pain since that time.  He has had nausea but no vomiting.  He has had no diarrhea.  Today he went to work not really been hungry noticed that the pain has moved from the right upper quadrant to now the right mid to lower quadrant.  He says standing or stretching makes the pain better sitting worsens it.  Says he is passing gas normally.Took 4  ibuprofen prior to this visit and states that the pain is improved.  In review of his medical records he has a history of kidney stones in March 2018.  He underwent cystoscopy and stent placement.  No problems since that time.  His stent was left-sided at that time measuring 7 mm.  Is this pain is much less than the pain he had with his kidney stone.           Past Medical History:  Diagnosis Date  . Kidney stone     Patient Active Problem List   Diagnosis Date Noted  . Nephrolithiasis 06/15/2016    Past Surgical History:  Procedure Laterality Date  . CYSTOSCOPY W/ URETERAL STENT PLACEMENT Left 06/01/2016   Procedure: CYSTOSCOPY WITH STENT  REPLACMENT;  Surgeon: Vanna Scotland, MD;  Location: [facility];  Service: Urology;  Laterality: Left;  . CYSTOSCOPY WITH STENT PLACEMENT Left 05/25/2016   Procedure: CYSTOSCOPY WITH STENT PLACEMENT;  Surgeon: Vanna Scotland, MD;  Location: [facility];  Service: Urology;  Laterality: Left;  . none    . URETEROSCOPY Left 05/25/2016   Procedure: URETEROSCOPY;  Surgeon: Vanna Scotland, MD;  Location: [facility];  Service: Urology;  Laterality: Left;  . URETEROSCOPY WITH HOLMIUM LASER LITHOTRIPSY Left 06/01/2016   Procedure: URETEROSCOPY WITH HOLMIUM LASER  LITHOTRIPSY;  Surgeon: Vanna Scotland, MD;  Location: [facility];  Service: Urology;  Laterality: Left;       Home Medications    Prior to Admission medications   Medication Sig Start Date End Date Taking? Authorizing Provider  HYDROcodone-acetaminophen (NORCO/VICODIN) 5-325 MG tablet Take 1-2 tablets by mouth every 6 (six) hours as needed. 11/23/17   Lutricia Feil, PA-C    Family History Family History  Problem Relation Age of Onset  . Heart attack Father   . Prostate cancer Neg Hx   . Kidney cancer Neg Hx   . Bladder Cancer Neg Hx     Social History Social History   Tobacco Use  . Smoking status: Never Smoker  . Smokeless tobacco: Never Used  Substance Use Topics  . Alcohol use: No  . Drug use: No     Allergies   Septra [sulfamethoxazole-trimethoprim]   Review of Systems Review of Systems  Constitutional: Positive for activity change and appetite change. Negative for chills, fatigue and fever.  Gastrointestinal: Positive for abdominal pain and nausea. Negative for constipation, diarrhea, rectal pain and vomiting.  All other systems reviewed and are negative.    Physical Exam Triage Vital Signs ED Triage Vitals  Enc Vitals Group     BP 11/23/17 1328 (!) 157/106     Pulse Rate 11/23/17 1328 90  Resp 11/23/17 1328 18     Temp 11/23/17 1328 98.5 F (36.9 C)     Temp Source 11/23/17 1328 Oral     SpO2 11/23/17 1328 98 %     Weight 11/23/17 1326 (!) 488 lb (221.4 kg)     Height 11/23/17 1326 6' (1.829 m)     Head Circumference --      Peak Flow --      Pain Score 11/23/17 1325 5     Pain Loc --      Pain Edu? --      Excl. in GC? --    No data found.  Updated Vital Signs BP (!) 157/106 (BP Location: Left Arm)   Pulse 90   Temp 98.5 F (36.9 C) (Oral)   Resp 18   Ht 6' (1.829 m)   Wt (!) 488 lb (221.4 kg)   SpO2 98%   BMI 66.18 kg/m   Visual Acuity Right Eye Distance:   Left Eye Distance:   Bilateral Distance:    Right Eye Near:     Left Eye Near:    Bilateral Near:     Physical Exam  Constitutional: He is oriented to person, place, and time. He appears well-developed and well-nourished.  Non-toxic appearance. He does not appear ill. No distress.  HENT:  Head: Normocephalic.  Mouth/Throat: No oropharyngeal exudate.  Eyes: Pupils are equal, round, and reactive to light.  Pulmonary/Chest: Effort normal and breath sounds normal.  Abdominal: Soft. Bowel sounds are decreased. There is tenderness in the right upper quadrant. There is no rigidity, no rebound, no guarding, no CVA tenderness and negative Murphy's sign.  Neurological: He is alert and oriented to person, place, and time.  Skin: Skin is warm and dry.  Psychiatric: He has a normal mood and affect. His behavior is normal.  Nursing note and vitals reviewed.    UC Treatments / Results  Labs (all labs ordered are listed, but only abnormal results are displayed) Labs Reviewed  COMPREHENSIVE METABOLIC PANEL - Abnormal; Notable for the following components:      Result Value   Glucose, Bld 109 (*)    All other components within normal limits  URINALYSIS, COMPLETE (UACMP) WITH MICROSCOPIC - Abnormal; Notable for the following components:   APPearance HAZY (*)    Hgb urine dipstick MODERATE (*)    Ketones, ur TRACE (*)    Protein, ur 30 (*)    All other components within normal limits  CBC WITH DIFFERENTIAL/PLATELET    EKG None  Radiology No results found.  Procedures Procedures (including critical care time)  Medications Ordered in UC Medications  ondansetron (ZOFRAN-ODT) disintegrating tablet 8 mg (8 mg Oral Given 11/23/17 1407)    Initial Impression / Assessment and Plan / UC Course  I have reviewed the triage vital signs and the nursing notes.  Pertinent labs & imaging results that were available during my care of the patient were reviewed by me and considered in my medical decision making (see chart for details). Laboratory is reassuring but  suggestive of possible kidney stone on the right side.  ReView of his CAT scan from March 2018 he had a 5  millimeters stone on the right upper pole of his kidney.  Exam today is nondiagnostic partly due to the patient's morbid obesity but really does not demonstrate an acute abdomen.  Discussed these findings with the patient and have given him the option of performing a CAT scan today for renal study or  waiting to see if he could pass a stone which would be possible with a 5 mm size stone.  They have opted to screen urine and go to the emergency room if his pain worsened.  I have given a few Vicodin and on the [facility] registry he did have hydromorphone back in March 2018 but no records of any opioids since that time.  She will screen all of his urine and also follow-up with his primary care physician.  He will go to the emergency room if he has any change in the character or intensity of his pain.    . Final Clinical Impressions(s) / UC Diagnoses   Final diagnoses:  Right upper quadrant abdominal pain  Personal history of kidney stones     Discharge Instructions     If your pain worsens  go immediately to the emergency room.  Strain all of your urine. Try taking Tylenol 500 mg combined with Ibuprofen 400 mg for pain every 6 hours for pain.    ED Prescriptions    Medication Sig Dispense Auth. Provider   HYDROcodone-acetaminophen (NORCO/VICODIN) 5-325 MG tablet Take 1-2 tablets by mouth every 6 (six) hours as needed. 10 tablet Lutricia Feil, PA-C     Controlled Substance Prescriptions [place] Controlled Substance Registry consulted?  Patient has not had any opioids since 2018.   Lutricia Feil, PA-C 11/23/17 1542

## 2018-01-14 IMAGING — XA DG C-ARM 61-120 MIN
4 series · 4 of 4 positions shown · non-contrast
Comparison: None.

CLINICAL DATA: Left stent placement

EXAM:
ABDOMEN - 1 VIEW; DG C-ARM 61-120 MIN

[Series 2: ortho standard · 1 of 1 slices shown (1 of 4)]
[im 1/1]
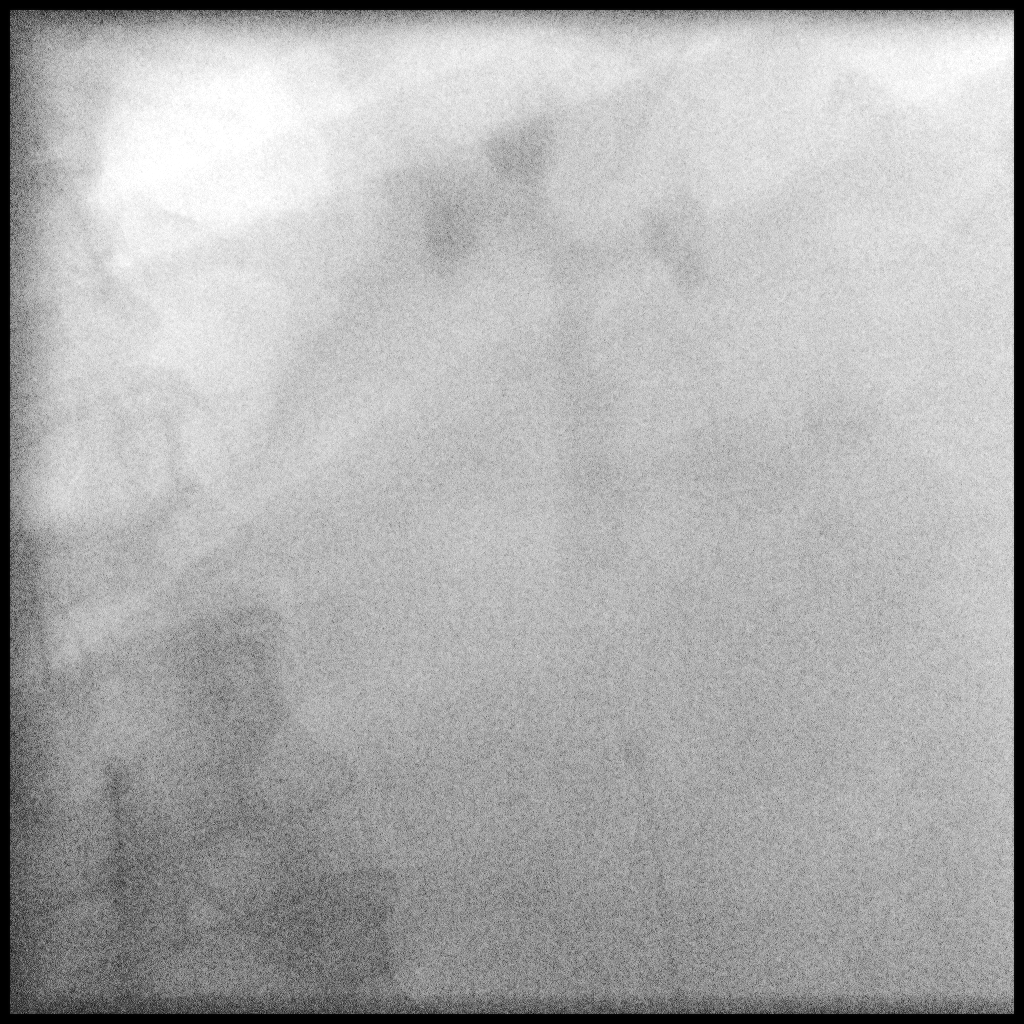

[Series 3: ortho standard · 1 of 1 slices shown (2 of 4)]
[im 1/1]
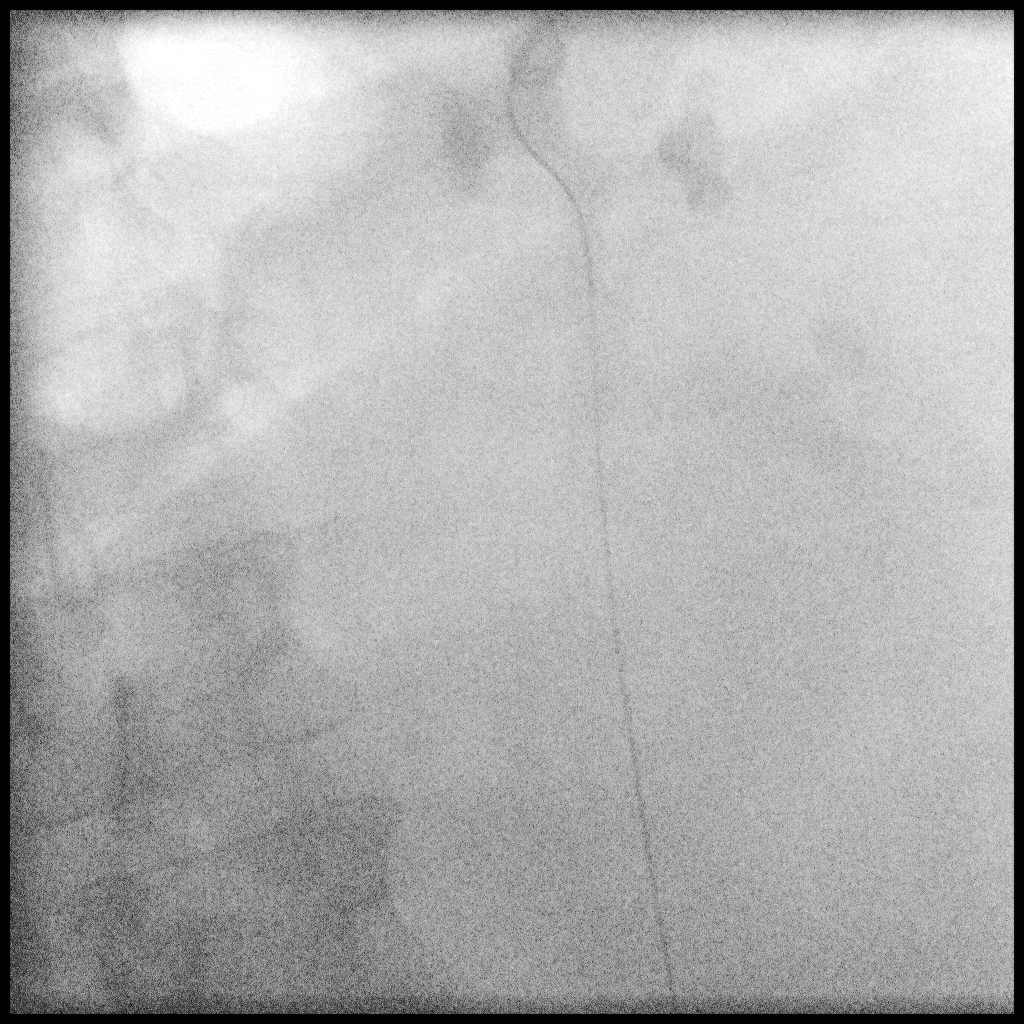

[Series 4: ortho standard · 1 of 1 slices shown (3 of 4)]
[im 1/1]
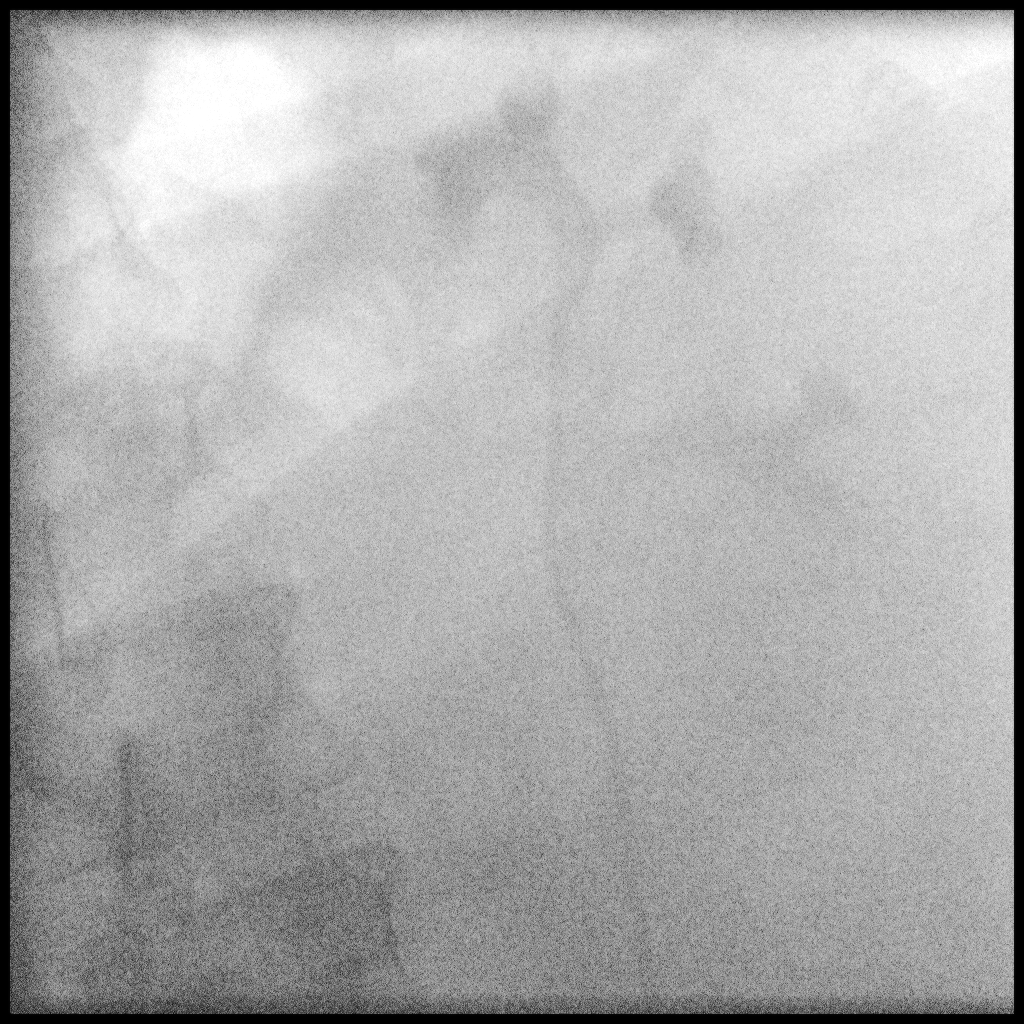

[Series 5: ortho standard · 1 of 1 slices shown (4 of 4)]
[im 1/1]
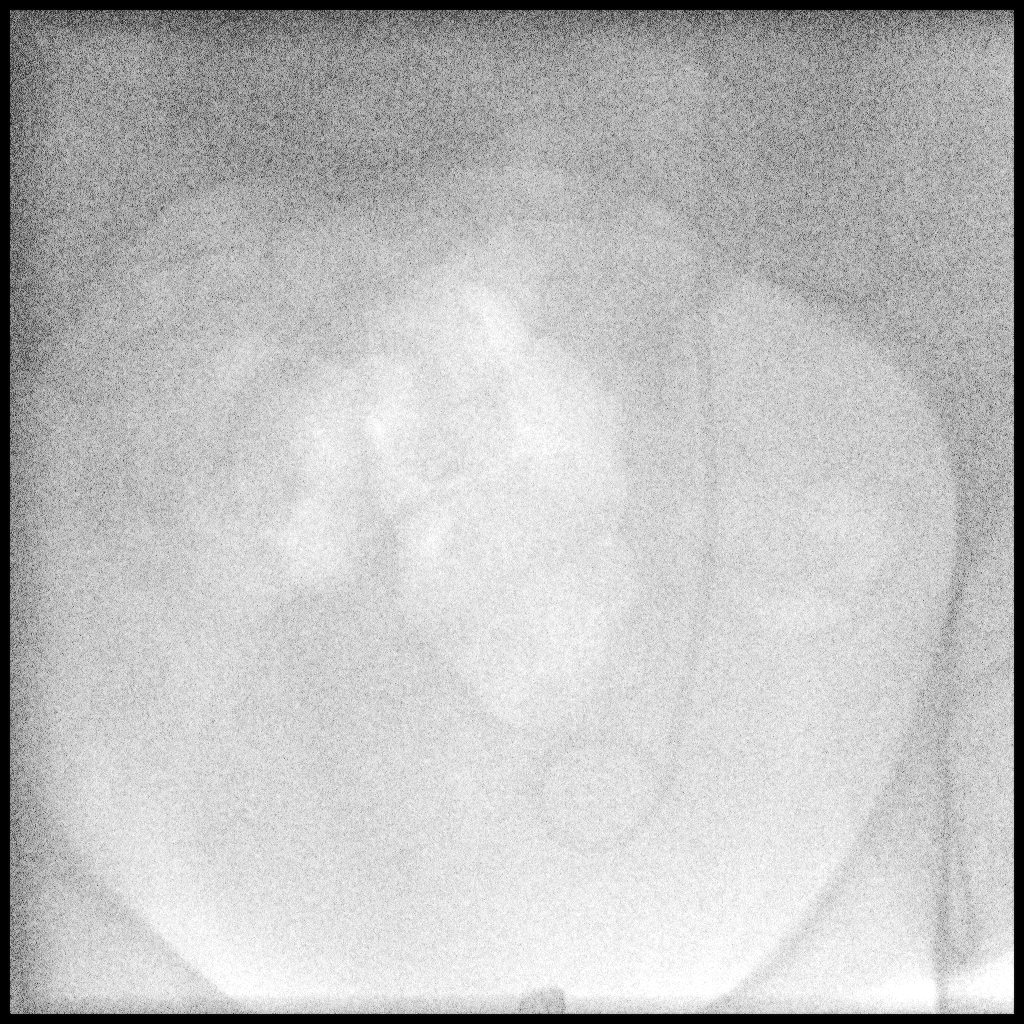

[4 of 4 positions shown; findings below may reference images not displayed]

FINDINGS: Four intraoperative spot images demonstrate placement of left
ureteral stent. Partial opacification of the left renal collecting
system and proximal ureter noted.
IMPRESSION: Placement of left ureteral stent.

## 2018-01-21 IMAGING — XA DG ABDOMEN 1V
3 series · 3 of 3 positions shown · non-contrast
Comparison: CT abdomen pelvis - 05/29/2016

CLINICAL DATA: Retrograde pyelogram

EXAM:
ABDOMEN - 1 VIEW; DG C-ARM 1-60 MIN-NO REPORT

[Series 3: ortho standard · 1 of 1 slices shown (1 of 3)]
[im 1/1]
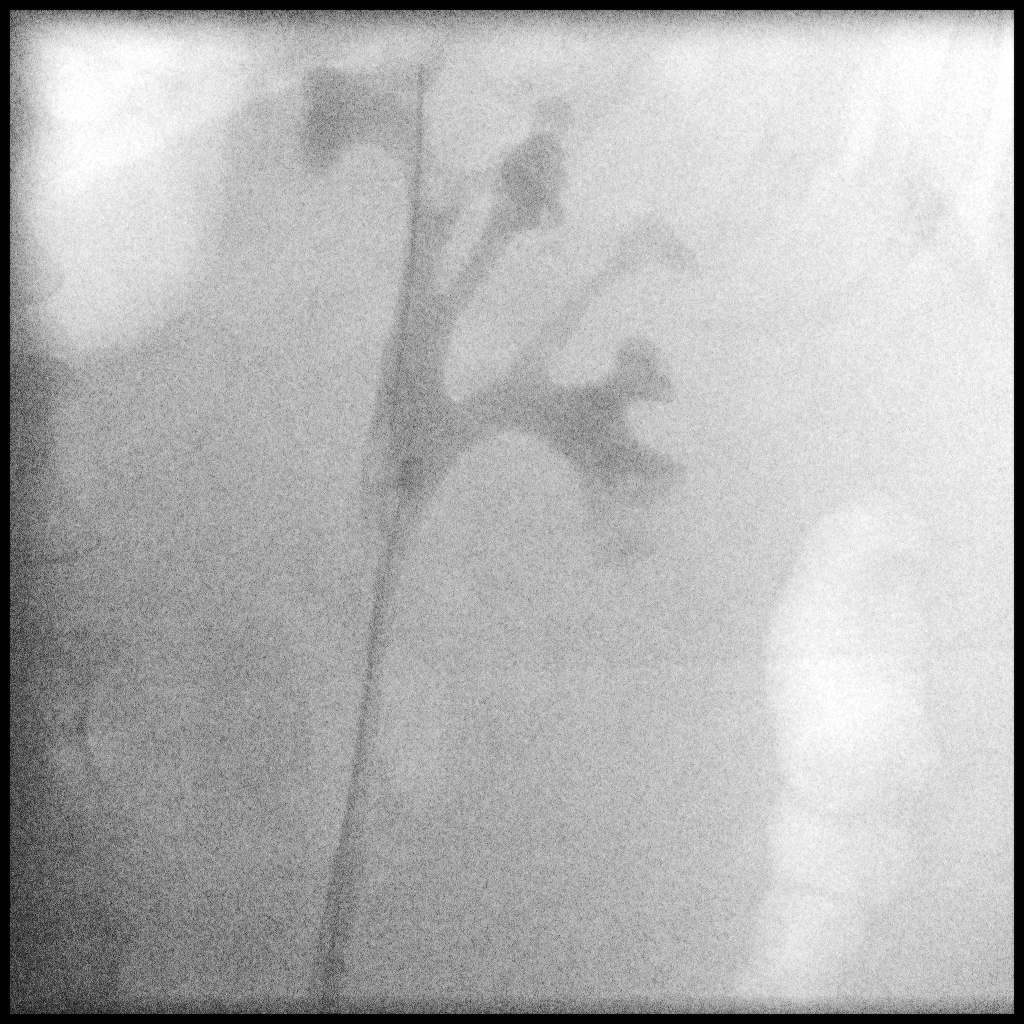

[Series 4: ortho standard · 1 of 1 slices shown (2 of 3)]
[im 1/1]
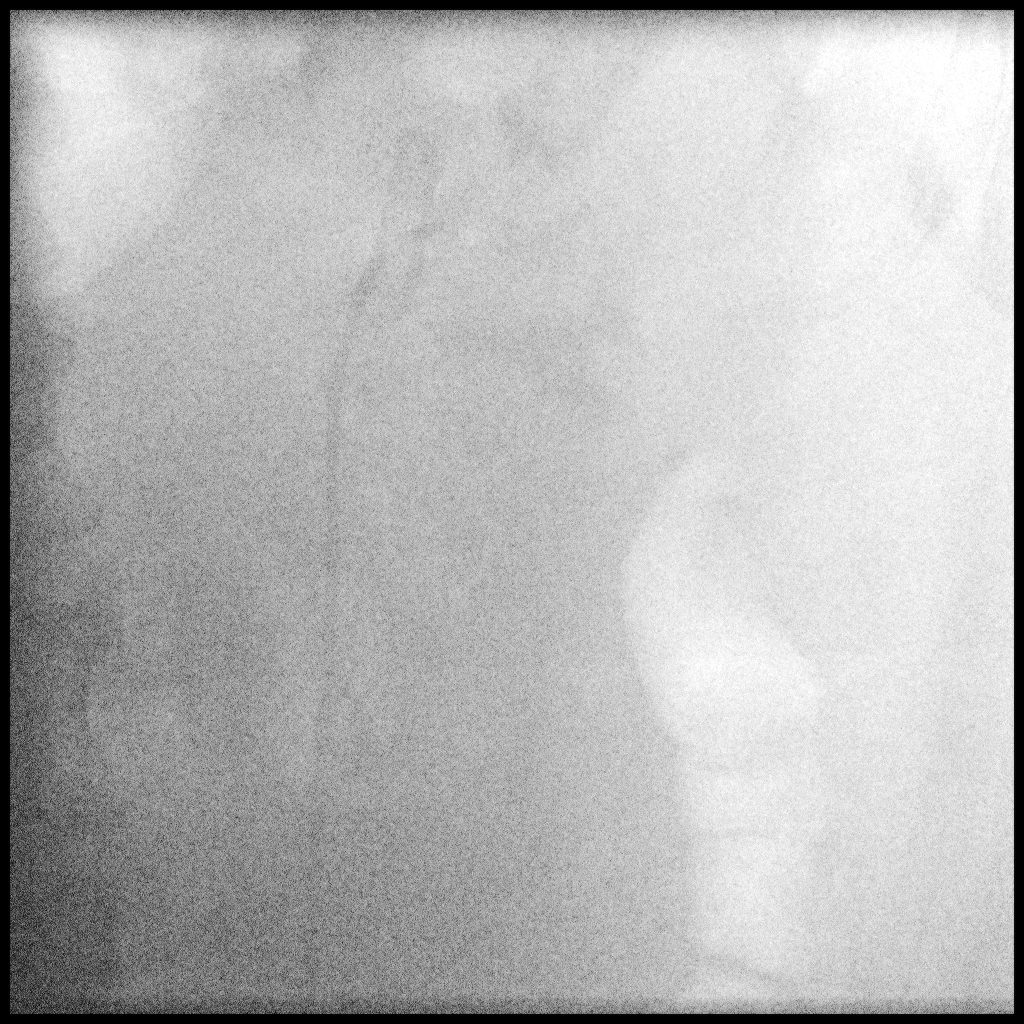

[Series 5: ortho standard · 1 of 1 slices shown (3 of 3)]
[im 1/1]
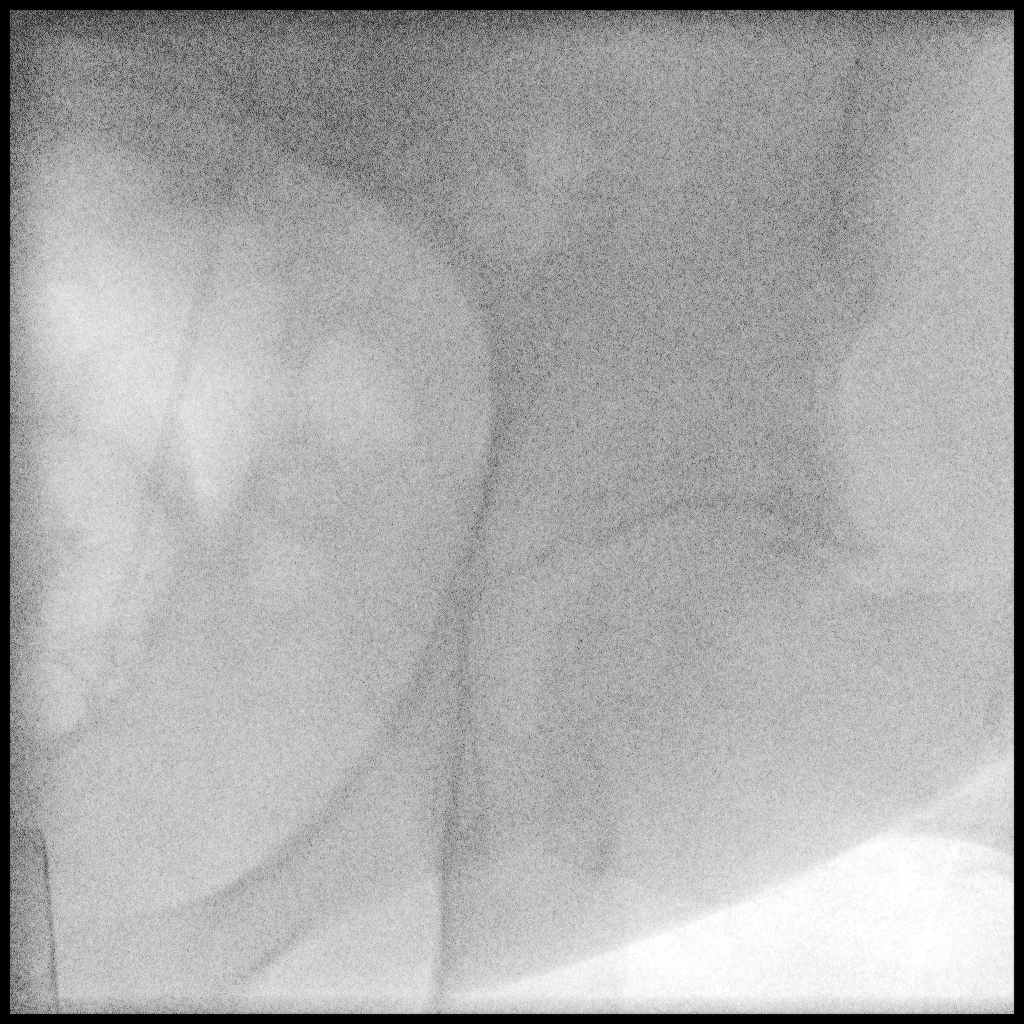

[3 of 3 positions shown; findings below may reference images not displayed]

FINDINGS: 3 spot intraoperative fluoroscopic images during right-sided
retrograde pyelogram and ureteral stent exchange are provided for
review (note, laterality was not provided).

Initial image demonstrates placement of a wire into the superior
aspect of the right renal collecting system. Contrast injection
demonstrates several ill-defined filling defects within superior and
inferior pole calices, potentially artifactual due to
underdistention.

Completion images demonstrate placement of a double-J ureteral stent
with superior coil overlying the left renal pelvis and inferior coil
overlying the urinary bladder.
IMPRESSION: Right-sided retrograde pyelogram and ureteral stent placement as
detailed above.

## 2019-05-28 ENCOUNTER — Emergency Department
Admission: EM | Admit: 2019-05-28 | Discharge: 2019-05-28 | Disposition: A | Discharge status: Home / Self Care | Payer: Managed Care, Other (non HMO) | Attending: Student | Admitting: Student

## 2019-05-28 ENCOUNTER — Other Ambulatory Visit: Payer: Self-pay

## 2019-05-28 DIAGNOSIS — Z79899 Other long term (current) drug therapy: Secondary | ICD-10-CM | POA: Insufficient documentation

## 2019-05-28 DIAGNOSIS — R1032 Left lower quadrant pain: Secondary | ICD-10-CM | POA: Insufficient documentation

## 2019-05-28 LAB — LIPASE, BLOOD: Lipase: 18 U/L (ref 11–51)

## 2019-05-28 LAB — CBC
HCT: 40.7 % (ref 39.0–52.0)
Hemoglobin: 13 g/dL (ref 13.0–17.0)
MCH: 29.7 pg (ref 26.0–34.0)
MCHC: 31.9 g/dL (ref 30.0–36.0)
MCV: 93.1 fL (ref 80.0–100.0)
Platelets: 159 10*3/uL (ref 150–400)
RBC: 4.37 MIL/uL (ref 4.22–5.81)
RDW: 12.9 % (ref 11.5–15.5)
WBC: 5.3 10*3/uL (ref 4.0–10.5)
nRBC: 0 % (ref 0.0–0.2)

## 2019-05-28 LAB — COMPREHENSIVE METABOLIC PANEL
ALT: 23 U/L (ref 0–44)
AST: 20 U/L (ref 15–41)
Albumin: 3.9 g/dL (ref 3.5–5.0)
Alkaline Phosphatase: 45 U/L (ref 38–126)
Anion gap: 5 (ref 5–15)
BUN: 16 mg/dL (ref 6–20)
CO2: 27 mmol/L (ref 22–32)
Calcium: 8.7 mg/dL — ABNORMAL LOW (ref 8.9–10.3)
Chloride: 106 mmol/L (ref 98–111)
Creatinine, Ser: 1.54 mg/dL — ABNORMAL HIGH (ref 0.61–1.24)
GFR calc Af Amer: >60 mL/min (ref >60)
GFR calc non Af Amer: 58 mL/min — ABNORMAL LOW (ref >60)
Glucose, Bld: 99 mg/dL (ref 70–99)
Potassium: 4.1 mmol/L (ref 3.5–5.1)
Sodium: 138 mmol/L (ref 135–145)
Total Bilirubin: 0.7 mg/dL (ref 0.3–1.2)
Total Protein: 7.5 g/dL (ref 6.5–8.1)

## 2019-05-28 LAB — URINALYSIS, COMPLETE (UACMP) WITH MICROSCOPIC
Bacteria, UA: NONE SEEN
Bilirubin Urine: NEGATIVE
Glucose, UA: NEGATIVE mg/dL
Ketones, ur: NEGATIVE mg/dL
Leukocytes,Ua: NEGATIVE
Nitrite: NEGATIVE
Protein, ur: NEGATIVE mg/dL
Specific Gravity, Urine: 1.019 (ref 1.005–1.030)
Squamous Epithelial / LPF: NONE SEEN (ref 0–5)
pH: 5 (ref 5.0–8.0)

## 2019-05-28 MED ORDER — OXYCODONE-ACETAMINOPHEN 5-325 MG PO TABS
1.0000 | ORAL_TABLET | Freq: Once | ORAL | Status: AC
  Administered 2019-05-28: 1 via ORAL
  Filled 2019-05-28: qty 1

## 2019-05-28 MED ORDER — ONDANSETRON 4 MG PO TBDP
8.0000 mg | ORAL_TABLET | Freq: Once | ORAL | Status: AC
  Administered 2019-05-28: 8 mg via ORAL
  Filled 2019-05-28: qty 2

## 2019-05-28 MED ORDER — KETOROLAC TROMETHAMINE 10 MG PO TABS: 10.0000 mg | ORAL_TABLET | Freq: Four times a day (QID) | ORAL | 0 refills | Status: DC | PRN

## 2019-05-28 MED ORDER — OXYCODONE-ACETAMINOPHEN 5-325 MG PO TABS: 1.0000 | ORAL_TABLET | Freq: Four times a day (QID) | ORAL | 0 refills | Status: DC | PRN

## 2019-05-28 MED ORDER — KETOROLAC TROMETHAMINE 30 MG/ML IJ SOLN
30.0000 mg | Freq: Once | INTRAMUSCULAR | Status: AC
  Administered 2019-05-28: 30 mg via INTRAVENOUS
  Filled 2019-05-28: qty 1

## 2019-05-28 MED ORDER — TAMSULOSIN HCL 0.4 MG PO CAPS: 0.4000 mg | ORAL_CAPSULE | Freq: Every day | ORAL | 0 refills | Status: DC

## 2019-05-28 MED ORDER — ONDANSETRON 4 MG PO TBDP: 4.0000 mg | ORAL_TABLET | Freq: Three times a day (TID) | ORAL | 0 refills | Status: DC | PRN

## 2019-05-28 NOTE — ED Triage Notes (Signed)
Pt comes in POV with some left abdominal and groin area for 3 days. Pt states that standing makes it feel better. Some off and on nausea.

## 2019-05-28 NOTE — ED Provider Notes (Signed)
[facility] Emergency Department Provider Note  ____________________________________________  Time seen: Approximately 5:24 PM  I have reviewed the triage vital signs and the nursing notes.   HISTORY  Chief Complaint Abdominal Pain    HPI Nathan Flynn is a 34 y.o. male who presents the emergency department complaining of ongoing left lower quadrant abdominal pain times several days.  Patient reports that he was at work, felt a sharp pain in his left lower quadrant.  This became so severe that he had to leave work.  Patient states that ibuprofen has been helping some of the symptoms but it only dulls the pain, before returning.  Patient has complained of pain radiating from the side into the groin region.  Patient denies any hematuria.  Patient has been nauseated from the pain but no emesis.  No diarrhea or constipation.  Patient has had no hematochezia.  He denies any hematuria.  No dysuria, penile drainage.         Past Medical History:  Diagnosis Date  . Kidney stone     Patient Active Problem List   Diagnosis Date Noted  . Nephrolithiasis 06/15/2016    Past Surgical History:  Procedure Laterality Date  . CYSTOSCOPY W/ URETERAL STENT PLACEMENT Left 06/01/2016   Procedure: CYSTOSCOPY WITH STENT  REPLACMENT;  Surgeon: Vanna Scotland, MD;  Location: [facility];  Service: Urology;  Laterality: Left;  . CYSTOSCOPY WITH STENT PLACEMENT Left 05/25/2016   Procedure: CYSTOSCOPY WITH STENT PLACEMENT;  Surgeon: Vanna Scotland, MD;  Location: [facility];  Service: Urology;  Laterality: Left;  . none    . URETEROSCOPY Left 05/25/2016   Procedure: URETEROSCOPY;  Surgeon: Vanna Scotland, MD;  Location: [facility];  Service: Urology;  Laterality: Left;  . URETEROSCOPY WITH HOLMIUM LASER LITHOTRIPSY Left 06/01/2016   Procedure: URETEROSCOPY WITH HOLMIUM LASER LITHOTRIPSY;  Surgeon: Vanna Scotland, MD;  Location: [facility];  Service: Urology;  Laterality: Left;     Prior to Admission medications   Medication Sig Start Date End Date Taking? Authorizing Provider  HYDROcodone-acetaminophen (NORCO/VICODIN) 5-325 MG tablet Take 1-2 tablets by mouth every 6 (six) hours as needed. 11/23/17   Lutricia Feil, PA-C  ketorolac (TORADOL) 10 MG tablet Take 1 tablet (10 mg total) by mouth every 6 (six) hours as needed. 05/28/19   Deep Bonawitz, Delorise Royals, PA-C  ondansetron (ZOFRAN-ODT) 4 MG disintegrating tablet Take 1 tablet (4 mg total) by mouth every 8 (eight) hours as needed for nausea or vomiting. 05/28/19   Waddell Iten, Delorise Royals, PA-C  oxyCODONE-acetaminophen (PERCOCET/ROXICET) 5-325 MG tablet Take 1 tablet by mouth every 6 (six) hours as needed for severe pain. 05/28/19   Rontavious Albright, Delorise Royals, PA-C  tamsulosin (FLOMAX) 0.4 MG CAPS capsule Take 1 capsule (0.4 mg total) by mouth daily. 05/28/19   Yassmin Binegar, Delorise Royals, PA-C    Allergies Septra [sulfamethoxazole-trimethoprim]  Family History  Problem Relation Age of Onset  . Heart attack Father   . Prostate cancer Neg Hx   . Kidney cancer Neg Hx   . Bladder Cancer Neg Hx     Social History Social History   Tobacco Use  . Smoking status: Never Smoker  . Smokeless tobacco: Never Used  Substance Use Topics  . Alcohol use: No  . Drug use: No     Review of Systems  Constitutional: No fever/chills Eyes: No visual changes. No discharge ENT: No upper respiratory complaints. Cardiovascular: no chest pain. Respiratory: no cough. No SOB. Gastrointestinal: Left-sided pain radiating into the left  lower quadrant into the left groin region.    No nausea, no vomiting.  No diarrhea.  No constipation. Genitourinary: Negative for dysuria. No hematuria Musculoskeletal: Negative for musculoskeletal pain. Skin: Negative for rash, abrasions, lacerations, ecchymosis. Neurological: Negative for headaches, focal weakness or numbness. 10-point ROS otherwise  negative.  ____________________________________________   PHYSICAL EXAM:  VITAL SIGNS: ED Triage Vitals  Enc Vitals Group     BP 05/28/19 1420 (!) 190/90     Pulse Rate 05/28/19 1420 94     Resp 05/28/19 1420 18     Temp 05/28/19 1420 99 F (37.2 C)     Temp Source 05/28/19 1420 Oral     SpO2 05/28/19 1420 99 %     Weight 05/28/19 1418 (!) 550 lb (249.5 kg)     Height 05/28/19 1418 6\' 2"  (1.88 m)     Head Circumference --      Peak Flow --      Pain Score 05/28/19 1418 6     Pain Loc --      Pain Edu? --      Excl. in GC? --      Constitutional: Alert and oriented. Well appearing and in no acute distress. Eyes: Conjunctivae are normal. PERRL. EOMI. Head: Atraumatic. ENT:      Ears:       Nose: No congestion/rhinnorhea.      Mouth/Throat: Mucous membranes are moist.  Neck: No stridor.    Cardiovascular: Normal rate, regular rhythm. Normal S1 and S2.  Good peripheral circulation. Respiratory: Normal respiratory effort without tachypnea or retractions. Lungs CTAB. Good air entry to the bases with no decreased or absent breath sounds. Gastrointestinal: No visible external abdominal findings.  Bowel sounds 4 quadrants. Soft and nontender to palpation. No guarding or rigidity. No palpable masses. No distention. No CVA tenderness. Musculoskeletal: Full range of motion to all extremities. No gross deformities appreciated. Neurologic:  Normal speech and language. No gross focal neurologic deficits are appreciated.  Skin:  Skin is warm, dry and intact. No rash noted. Psychiatric: Mood and affect are normal. Speech and behavior are normal. Patient exhibits appropriate insight and judgement.   ____________________________________________   LABS (all labs ordered are listed, but only abnormal results are displayed)  Labs Reviewed  COMPREHENSIVE METABOLIC PANEL - Abnormal; Notable for the following components:      Result Value   Creatinine, Ser 1.54 (*)    Calcium 8.7 (*)     GFR calc non Af Amer 58 (*)    All other components within normal limits  URINALYSIS, COMPLETE (UACMP) WITH MICROSCOPIC - Abnormal; Notable for the following components:   Color, Urine YELLOW (*)    APPearance CLEAR (*)    Hgb urine dipstick SMALL (*)    All other components within normal limits  LIPASE, BLOOD  CBC   ____________________________________________  EKG   ____________________________________________  RADIOLOGY   No results found.  ____________________________________________    PROCEDURES  Procedure(s) performed:    Procedures    Medications  ketorolac (TORADOL) 30 MG/ML injection 30 mg (has no administration in time range)  oxyCODONE-acetaminophen (PERCOCET/ROXICET) 5-325 MG per tablet 1 tablet (1 tablet Oral Given 05/28/19 1817)  ondansetron (ZOFRAN-ODT) disintegrating tablet 8 mg (8 mg Oral Given 05/28/19 1817)     ____________________________________________   INITIAL IMPRESSION / ASSESSMENT AND PLAN / ED COURSE  Pertinent labs & imaging results that were available during my care of the patient were reviewed by me and considered in my medical  decision making (see chart for details).  Review of the [place] CSRS was performed in accordance of the [place] prior to dispensing any controlled drugs.           Patient's diagnosis is consistent with left side pain radiating into the left groin region.  No testicular pain.  Patient presented to emergency department complaining of left side pain that has radiated into the left lower quadrant and into the left groin region.  Patient denies any trauma.  No emesis, diarrhea or constipation.  Patient does have a history of kidney stones.  Labs are reassuring with findings with mild hematuria.  No other significant findings on labs.  Exam was also overall benign.  Given the patient pain complaints, body habitus limiting the exam somewhat I did recommend CT scan.  Unfortunately our CT scan able to accommodate the  patient was broken for the next 36 hours.  I have discussed this with patient and offered to transfer the patient to another hospital for imaging versus treating for most likely diagnosis of nephrolithiasis.  Patient would prefer to be treated and discharged at this time.  Patient has strict return precautions for any new or worsening symptoms.  If symptoms are not improving with medication regimen he should follow-up for imaging at that time.  Patient is very understanding of our inability to image him at this time and is agreeable to the plan.. Patient will be discharged home with prescriptions for Flomax, Percocet, Toradol. Patient is to follow up with primary care as needed or otherwise directed. Patient is given ED precautions to return to the ED for any worsening or new symptoms.     ____________________________________________  FINAL CLINICAL IMPRESSION(S) / ED DIAGNOSES  Final diagnoses:  Left lower quadrant abdominal pain      NEW MEDICATIONS STARTED DURING THIS VISIT:  ED Discharge Orders         Ordered    oxyCODONE-acetaminophen (PERCOCET/ROXICET) 5-325 MG tablet  Every 6 hours PRN     05/28/19 1853    ondansetron (ZOFRAN-ODT) 4 MG disintegrating tablet  Every 8 hours PRN     05/28/19 1853    tamsulosin (FLOMAX) 0.4 MG CAPS capsule  Daily     05/28/19 1853    ketorolac (TORADOL) 10 MG tablet  Every 6 hours PRN     05/28/19 1853              This chart was dictated using voice recognition software/Dragon. Despite best efforts to proofread, errors can occur which can change the meaning. Any change was purely unintentional.    Darletta Moll, PA-C 05/28/19 1853    Lilia Pro., MD 05/29/19 [phone]

## 2019-12-08 ENCOUNTER — Ambulatory Visit
Admission: RE | Admit: 2019-12-08 | Discharge: 2019-12-08 | Disposition: A | Discharge status: Home / Self Care | Payer: Managed Care, Other (non HMO) | Source: Ambulatory Visit | Attending: Emergency Medicine | Admitting: Emergency Medicine

## 2019-12-08 ENCOUNTER — Other Ambulatory Visit: Payer: Self-pay

## 2019-12-08 VITALS — BP 176/94 | HR 80 | Temp 98.7°F | Resp 18 | Ht 72.0 in | Wt >= 6400 oz

## 2019-12-08 DIAGNOSIS — K047 Periapical abscess without sinus: Secondary | ICD-10-CM

## 2019-12-08 MED ORDER — AMOXICILLIN-POT CLAVULANATE 875-125 MG PO TABS: 1.0000 | ORAL_TABLET | Freq: Two times a day (BID) | ORAL | 0 refills | Status: AC

## 2019-12-08 NOTE — ED Provider Notes (Signed)
[facility]    CSN: [id number] Arrival date & time: 12/08/19  0957      History   Chief Complaint Chief Complaint  Patient presents with   Jaw Pain    Left side    HPI Nathan Flynn is a 34 y.o. male.   Nathan Flynn presents with complaints of left jaw pain. Two days ago felt pain to left upper jaw. Worse last night. Now with Jaw, left under eye, left ear, left neck pain. Left facial swelling. Originally did have specific dental pain. Has had sinus infections with similar swelling in the past as well. Doesn't follow with a dentist. No fevers. No difficulty swallowing. Pain with opening of jaw. Hasn't been able to eat today due to pain. Taking tylenol which helps some. Pain 7/10. No nasal drainage, no sore throat. No arm or chest pain.    ROS per HPI, negative if not otherwise mentioned.      Past Medical History:  Diagnosis Date   Kidney stone     Patient Active Problem List   Diagnosis Date Noted   Nephrolithiasis 06/15/2016    Past Surgical History:  Procedure Laterality Date   CYSTOSCOPY W/ URETERAL STENT PLACEMENT Left 06/01/2016   Procedure: CYSTOSCOPY WITH STENT  REPLACMENT;  Surgeon: Vanna Scotland, MD;  Location: [facility];  Service: Urology;  Laterality: Left;   CYSTOSCOPY WITH STENT PLACEMENT Left 05/25/2016   Procedure: CYSTOSCOPY WITH STENT PLACEMENT;  Surgeon: Vanna Scotland, MD;  Location: [facility];  Service: Urology;  Laterality: Left;   none     URETEROSCOPY Left 05/25/2016   Procedure: URETEROSCOPY;  Surgeon: Vanna Scotland, MD;  Location: [facility];  Service: Urology;  Laterality: Left;   URETEROSCOPY WITH HOLMIUM LASER LITHOTRIPSY Left 06/01/2016   Procedure: URETEROSCOPY WITH HOLMIUM LASER LITHOTRIPSY;  Surgeon: Vanna Scotland, MD;  Location: [facility];  Service: Urology;  Laterality: Left;       Home Medications    Prior to Admission medications   Medication Sig Start Date End Date Taking? Authorizing  Provider  amoxicillin-clavulanate (AUGMENTIN) 875-125 MG tablet Take 1 tablet by mouth every 12 (twelve) hours for 10 days. 12/08/19 12/18/19  Georgetta Haber, NP  HYDROcodone-acetaminophen (NORCO/VICODIN) 5-325 MG tablet Take 1-2 tablets by mouth every 6 (six) hours as needed. 11/23/17   Lutricia Feil, PA-C  ketorolac (TORADOL) 10 MG tablet Take 1 tablet (10 mg total) by mouth every 6 (six) hours as needed. 05/28/19   Cuthriell, Delorise Royals, PA-C  ondansetron (ZOFRAN-ODT) 4 MG disintegrating tablet Take 1 tablet (4 mg total) by mouth every 8 (eight) hours as needed for nausea or vomiting. 05/28/19   Cuthriell, Delorise Royals, PA-C  oxyCODONE-acetaminophen (PERCOCET/ROXICET) 5-325 MG tablet Take 1 tablet by mouth every 6 (six) hours as needed for severe pain. 05/28/19   Cuthriell, Delorise Royals, PA-C  tamsulosin (FLOMAX) 0.4 MG CAPS capsule Take 1 capsule (0.4 mg total) by mouth daily. 05/28/19   Cuthriell, Delorise Royals, PA-C    Family History Family History  Problem Relation Age of Onset   Heart attack Father    Prostate cancer Neg Hx    Kidney cancer Neg Hx    Bladder Cancer Neg Hx     Social History Social History   Tobacco Use   Smoking status: Never Smoker   Smokeless tobacco: Never Used  Vaping Use   Vaping Use: Never used  Substance Use Topics   Alcohol use: No   Drug use: No  Allergies   Septra [sulfamethoxazole-trimethoprim]   Review of Systems Review of Systems   Physical Exam Triage Vital Signs ED Triage Vitals  Enc Vitals Group     BP 12/08/19 1015 (!) 176/94     Pulse Rate 12/08/19 1015 80     Resp 12/08/19 1015 18     Temp 12/08/19 1015 98.7 F (37.1 C)     Temp Source 12/08/19 1015 Oral     SpO2 12/08/19 1015 97 %     Weight 12/08/19 1016 (!) 499 lb 3.2 oz (226.4 kg)     Height 12/08/19 1016 6' (1.829 m)     Head Circumference --      Peak Flow --      Pain Score 12/08/19 1015 7     Pain Loc --      Pain Edu? --      Excl. in GC? --    No  data found.  Updated Vital Signs BP (!) 176/94 (BP Location: Left Arm)    Pulse 80    Temp 98.7 F (37.1 C) (Oral)    Resp 18    Ht 6' (1.829 m)    Wt (!) 499 lb 3.2 oz (226.4 kg)    SpO2 97%    BMI 67.70 kg/m   Visual Acuity Right Eye Distance:   Left Eye Distance:   Bilateral Distance:    Right Eye Near:   Left Eye Near:    Bilateral Near:     Physical Exam Constitutional:      Appearance: He is well-developed.  HENT:     Head:     Jaw: Tenderness and pain on movement present.      Mouth/Throat:     Dentition: Abnormal dentition. Dental tenderness and dental caries present.      Comments: Broken off molars to left upper jaw which have surrounding swelling and very tender; left face and maxillary sinus with tenderness and mild swelling; no TM joint tenderness Cardiovascular:     Rate and Rhythm: Normal rate.  Pulmonary:     Effort: Pulmonary effort is normal.  Skin:    General: Skin is warm and dry.  Neurological:     Mental Status: He is alert and oriented to person, place, and time.      UC Treatments / Results  Labs (all labs ordered are listed, but only abnormal results are displayed) Labs Reviewed - No data to display  EKG   Radiology No results found.  Procedures Procedures (including critical care time)  Medications Ordered in UC Medications - No data to display  Initial Impression / Assessment and Plan / UC Course  I have reviewed the triage vital signs and the nursing notes.  Pertinent labs & imaging results that were available during my care of the patient were reviewed by me and considered in my medical decision making (see chart for details).     Dental abscess with antibiotics initiated. Encouraged follow up with dentist for definitive treatment. Patient verbalized understanding and agreeable to plan.   Final Clinical Impressions(s) / UC Diagnoses   Final diagnoses:  Dental infection     Discharge Instructions     Start  antibiotics.  Warm compresses may help as well.  Continue with 1000mg  of tylenol every 8 hours.  Up to 800mg  of ibuprofen every hours. Take with food.  Follow up with dentist.     ED Prescriptions    Medication Sig Dispense Auth. Provider   amoxicillin-clavulanate (AUGMENTIN) 875-125  MG tablet Take 1 tablet by mouth every 12 (twelve) hours for 10 days. 20 tablet Georgetta Haber, NP     PDMP not reviewed this encounter.   Georgetta Haber, NP 12/08/19 1047

## 2019-12-08 NOTE — Discharge Instructions (Signed)
Start antibiotics.  Warm compresses may help as well.  Continue with 1000mg  of tylenol every 8 hours.  Up to 800mg  of ibuprofen every hours. Take with food.  Follow up with dentist.

## 2019-12-08 NOTE — ED Triage Notes (Addendum)
Patient in today for left jaw pain. Patient states his eye, cheek, teeth, ear, and neck hurt. Sx onset 2 nights ago. Patient denies numbness or tingling in left arm, chest pain.

## 2020-06-18 ENCOUNTER — Encounter: Payer: Self-pay | Admitting: Family Medicine

## 2020-06-18 ENCOUNTER — Other Ambulatory Visit: Payer: Self-pay

## 2020-06-18 ENCOUNTER — Ambulatory Visit (INDEPENDENT_AMBULATORY_CARE_PROVIDER_SITE_OTHER): Payer: 59 | Admitting: Family Medicine

## 2020-06-18 ENCOUNTER — Ambulatory Visit
Admission: RE | Admit: 2020-06-18 | Discharge: 2020-06-18 | Disposition: A | Discharge status: Home / Self Care | Payer: 59 | Attending: Family Medicine | Admitting: Family Medicine

## 2020-06-18 ENCOUNTER — Ambulatory Visit
Admission: RE | Admit: 2020-06-18 | Discharge: 2020-06-18 | Disposition: A | Discharge status: Home / Self Care | Payer: 59 | Source: Ambulatory Visit | Attending: Family Medicine | Admitting: Family Medicine

## 2020-06-18 VITALS — BP 153/77 | HR 90 | Ht 72.0 in | Wt >= 6400 oz

## 2020-06-18 DIAGNOSIS — Z87442 Personal history of urinary calculi: Secondary | ICD-10-CM

## 2020-06-18 DIAGNOSIS — R300 Dysuria: Secondary | ICD-10-CM

## 2020-06-18 DIAGNOSIS — Z7689 Persons encountering health services in other specified circumstances: Secondary | ICD-10-CM | POA: Diagnosis not present

## 2020-06-18 DIAGNOSIS — N200 Calculus of kidney: Secondary | ICD-10-CM

## 2020-06-18 DIAGNOSIS — R109 Unspecified abdominal pain: Secondary | ICD-10-CM | POA: Diagnosis present

## 2020-06-18 LAB — POCT URINALYSIS DIPSTICK
Bilirubin, UA: NEGATIVE
Glucose, UA: NEGATIVE
Ketones, UA: NEGATIVE
Nitrite, UA: POSITIVE
Protein, UA: POSITIVE — AB
Spec Grav, UA: 1.015 (ref 1.010–1.025)
Urobilinogen, UA: 4 E.U./dL — AB
pH, UA: 7 (ref 5.0–8.0)

## 2020-06-18 LAB — CBC WITH DIFFERENTIAL/PLATELET: RBC: 4.84 10*6/uL (ref 4.20–5.80)

## 2020-06-18 MED ORDER — TAMSULOSIN HCL 0.4 MG PO CAPS: 0.4000 mg | ORAL_CAPSULE | Freq: Every day | ORAL | 0 refills | Status: DC

## 2020-06-18 MED ORDER — CYCLOBENZAPRINE HCL 10 MG PO TABS
10.0000 mg | ORAL_TABLET | Freq: Three times a day (TID) | ORAL | 0 refills | Status: DC | PRN
Start: 2020-06-18 — End: 2020-07-08

## 2020-06-18 MED ORDER — CEPHALEXIN 500 MG PO CAPS: 500.0000 mg | ORAL_CAPSULE | Freq: Three times a day (TID) | ORAL | 0 refills | Status: DC

## 2020-06-18 NOTE — Patient Instructions (Addendum)
Thank you for coming to the office today.  Stay tuned for update from Urologist once we submit referral after I have your results.  [facility] -1st floor [address] [place],  [place]  [address] Phone: [phone]  Start the Flomax once a day for 2 weeks can repeat if you need to help stone pass.   1. You have a Urinary Tract Infection - this is very common, your symptoms are reassuring and you should get better within 1 week on the antibiotics - Start Keflex 500mg  3 times daily for next 7 days, complete entire course, even if feeling better - We sent urine for a culture, we will call you within next few days if we need to change antibiotics - Please drink plenty of fluids, improve hydration over next 1 week  If symptoms worsening, developing nausea / vomiting, worsening back pain, fevers / chills / sweats, then please return for re-evaluation sooner.  KUB X-ray today stay tuned for results looking for stone.  Printed Flexeril muscle relaxant, if needed for pain or spasms, can take as need, caution sedation.    Please schedule a Follow-up Appointment to: Return in about 4 weeks (around 07/16/2020) for 4-6 weeks follow-up kidney stone and other follow-up, future physical when ready.  If you have any other questions or concerns, please feel free to call the office or send a message through MyChart. You may also schedule an earlier appointment if necessary.  Additionally, you may be receiving a survey about your experience at our office within a few days to 1 week by e-mail or mail. We value your feedback.  09/15/2020, DO [facility], [facility]

## 2020-06-18 NOTE — Progress Notes (Signed)
Subjective:    Patient ID: Nathan Flynn, male    DOB: 10/09/1985, 35 y.o.   MRN: [id number]  Nathan SarksJason Michael Khatib is a 35 y.o. male presenting on 06/18/2020 for Establish Care and Nephrolithiasis  New insurance, relocated to [place], establishing care. No prior regular PCP   Here with wife Baird LyonsCasey and their son.  HPI   Nephrolithiasis Reports symptoms started about 2 weeks ago with increasing urinary frequency, no symptoms during the urination, he did have some slight blood in urine with "pinkish" light tinge color to urine but that has since resolved now, he would feel a soreness or burning pain at end of the urination. Symptoms did ease off some, then he started to feel a deeper internal pain in bladder, and some radiation of stone to R side of flank, seems to come and go in waves, can be mild 0 to 1 out of 10 pain residual soreness, and some episodic flank pain more severe up to 4-5 out 10 at times.  - Prior history of >3 years ago with R sided Kidney Stone  Currently he has reduced appetite, but has no problem eating, has been trying to force himself to eat some and drink regularly. He has not been drinking fluid regularly.  Taking Ibuprofen 200mg  x 2-3 every 4-6 hours daily PRN, when hurts.  Not having any sciatica or low back problem.  Identical twin has reaction to Bactrim, he does not, it was a presumed allergy.  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  No flowsheet data found.  Past Medical History:  Diagnosis Date  . Kidney stone    Past Surgical History:  Procedure Laterality Date  . CYSTOSCOPY W/ URETERAL STENT PLACEMENT Left 06/01/2016   Procedure: CYSTOSCOPY WITH STENT  REPLACMENT;  Surgeon: Vanna ScotlandAshley Brandon, MD;  Location: [facility];  Service: Urology;  Laterality: Left;  . CYSTOSCOPY WITH STENT PLACEMENT Left 05/25/2016   Procedure: CYSTOSCOPY  WITH STENT PLACEMENT;  Surgeon: Vanna ScotlandAshley Brandon, MD;  Location: [facility];  Service: Urology;  Laterality: Left;  . none    . URETEROSCOPY Left 05/25/2016   Procedure: URETEROSCOPY;  Surgeon: Vanna ScotlandAshley Brandon, MD;  Location: [facility];  Service: Urology;  Laterality: Left;  . URETEROSCOPY WITH HOLMIUM LASER LITHOTRIPSY Left 06/01/2016   Procedure: URETEROSCOPY WITH HOLMIUM LASER LITHOTRIPSY;  Surgeon: Vanna ScotlandAshley Brandon, MD;  Location: [facility];  Service: Urology;  Laterality: Left;   Social History   Socioeconomic History  . Marital status: Married    Spouse name: Not on file  . Number of children: Not on file  . Years of education: Not on file  . Highest education level: Not on file  Occupational History  . Not on file  Tobacco Use  . Smoking status: Never Smoker  . Smokeless tobacco: Never Used  Vaping Use  . Vaping Use: Never used  Substance and Sexual Activity  . Alcohol use: No  . Drug use: No  . Sexual activity: Not on file  Other Topics Concern  . Not on file  Social History Narrative  . Not on file   Social Determinants of Health   Financial Resource Strain: Not on file  Food Insecurity: Not on file  Transportation Needs: Not on file  Physical Activity: Not on file  Stress: Not on file  Social Connections: Not on file  Intimate Partner Violence: Not on file  Family History  Problem Relation Age of Onset  . Heart attack Father   . Prostate cancer Neg Hx   . Kidney cancer Neg Hx   . Bladder Cancer Neg Hx    Current Outpatient Medications on File Prior to Visit  Medication Sig  . ibuprofen (ADVIL) 200 MG tablet Take 200 mg by mouth as needed.   No current facility-administered medications on file prior to visit.    Review of Systems Per HPI unless specifically indicated above       Objective:    BP (!) 153/77   Pulse 90   Ht 6' (1.829 m)   Wt (!) 501 lb 3.2 oz (227.3 kg)   SpO2 98%   BMI 67.97 kg/m   Wt Readings from Last 3 Encounters:  06/18/20 (!)  501 lb 3.2 oz (227.3 kg)  12/08/19 (!) 499 lb 3.2 oz (226.4 kg)  05/28/19 (!) 550 lb (249.5 kg)    Physical Exam Vitals and nursing note reviewed.  Constitutional:      General: He is not in acute distress.    Appearance: He is well-developed. He is obese. He is not diaphoretic.     Comments: Well-appearing, comfortable, cooperative  HENT:     Head: Normocephalic and atraumatic.  Eyes:     General:        Right eye: No discharge.        Left eye: No discharge.     Conjunctiva/sclera: Conjunctivae normal.  Neck:     Thyroid: No thyromegaly.  Cardiovascular:     Rate and Rhythm: Normal rate and regular rhythm.     Heart sounds: Normal heart sounds. No murmur heard.   Pulmonary:     Effort: Pulmonary effort is normal. No respiratory distress.     Breath sounds: Normal breath sounds. No wheezing or rales.  Musculoskeletal:        General: Normal range of motion.     Cervical back: Normal range of motion and neck supple.     Comments: No provoked back or R flank pain. Or abdominal pain  No SLR pain or radicular symptoms  Lymphadenopathy:     Cervical: No cervical adenopathy.  Skin:    General: Skin is warm and dry.     Findings: No erythema or rash.  Neurological:     Mental Status: He is alert and oriented to person, place, and time.  Psychiatric:        Behavior: Behavior normal.     Comments: Well groomed, good eye contact, normal speech and thoughts       Results for orders placed or performed during the hospital encounter of 05/28/19  Lipase, blood  Result Value Ref Range   Lipase 18 11 - 51 U/L  Comprehensive metabolic panel  Result Value Ref Range   Sodium 138 135 - 145 mmol/L   Potassium 4.1 3.5 - 5.1 mmol/L   Chloride 106 98 - 111 mmol/L   CO2 27 22 - 32 mmol/L   Glucose, Bld 99 70 - 99 mg/dL   BUN 16 6 - 20 mg/dL   Creatinine, Ser 0.97 (H) 0.61 - 1.24 mg/dL   Calcium 8.7 (L) 8.9 - 10.3 mg/dL   Total Protein 7.5 6.5 - 8.1 g/dL   Albumin 3.9 3.5 - 5.0  g/dL   AST 20 15 - 41 U/L   ALT 23 0 - 44 U/L   Alkaline Phosphatase 45 38 - 126 U/L   Total Bilirubin 0.7 0.3 -  1.2 mg/dL   GFR calc non Af Amer 58 (L) >60 mL/min   GFR calc Af Amer >60 >60 mL/min   Anion gap 5 5 - 15  CBC  Result Value Ref Range   WBC 5.3 4.0 - 10.5 K/uL   RBC 4.37 4.22 - 5.81 MIL/uL   Hemoglobin 13.0 13.0 - 17.0 g/dL   HCT 77.8 24.2 - 35.3 %   MCV 93.1 80.0 - 100.0 fL   MCH 29.7 26.0 - 34.0 pg   MCHC 31.9 30.0 - 36.0 g/dL   RDW 61.4 43.1 - 54.0 %   Platelets 159 150 - 400 K/uL   nRBC 0.0 0.0 - 0.2 %  Urinalysis, Complete w Microscopic  Result Value Ref Range   Color, Urine YELLOW (A) YELLOW   APPearance CLEAR (A) CLEAR   Specific Gravity, Urine 1.019 1.005 - 1.030   pH 5.0 5.0 - 8.0   Glucose, UA NEGATIVE NEGATIVE mg/dL   Hgb urine dipstick SMALL (A) NEGATIVE   Bilirubin Urine NEGATIVE NEGATIVE   Ketones, ur NEGATIVE NEGATIVE mg/dL   Protein, ur NEGATIVE NEGATIVE mg/dL   Nitrite NEGATIVE NEGATIVE   Leukocytes,Ua NEGATIVE NEGATIVE   RBC / HPF 0-5 0 - 5 RBC/hpf   WBC, UA 0-5 0 - 5 WBC/hpf   Bacteria, UA NONE SEEN NONE SEEN   Squamous Epithelial / LPF NONE SEEN 0 - 5   Mucus PRESENT       Assessment & Plan:   Problem List Items Addressed This Visit   None   Visit Diagnoses    Dysuria    -  Primary   Relevant Medications   cephALEXin (KEFLEX) 500 MG capsule   Other Relevant Orders   POCT urinalysis dipstick   Urine Culture   BASIC METABOLIC PANEL WITH GFR   CBC with Differential/Platelet   Acute right flank pain       Relevant Medications   tamsulosin (FLOMAX) 0.4 MG CAPS capsule   cyclobenzaprine (FLEXERIL) 10 MG tablet   cephALEXin (KEFLEX) 500 MG capsule   Other Relevant Orders   DG Abd 1 View   POCT urinalysis dipstick   BASIC METABOLIC PANEL WITH GFR   CBC with Differential/Platelet   Encounter to establish care with new doctor       History of nephrolithiasis       Relevant Medications   tamsulosin (FLOMAX) 0.4 MG CAPS capsule    Other Relevant Orders   DG Abd 1 View   POCT urinalysis dipstick   Urine Culture   BASIC METABOLIC PANEL WITH GFR   CBC with Differential/Platelet   Right nephrolithiasis       Relevant Medications   tamsulosin (FLOMAX) 0.4 MG CAPS capsule   cyclobenzaprine (FLEXERIL) 10 MG tablet   cephALEXin (KEFLEX) 500 MG capsule   Other Relevant Orders   DG Abd 1 View   POCT urinalysis dipstick   Urine Culture     History suggestive of recurrent UTI now R sided symptoms Mild renal colic not active today Not consistent with back or MSK pain Has urinary symptoms concern for UTI as well UA dipstick shows positive - will treat and order Urine Culture pending. Start Keflex 500 TID x 7 days  KUB STAT result today showed R kidney stone see below, copied results as well.  Start Flomax 0.4mg  daily for stone Printed Flexeril PRN muscle spasm if need Defer stronger medication for pain / nausea vomiting given lack of symptoms today  Labs BMET + CBC today  Urgent Referral - to urology for kidney stone, larger stone 1 cm x 0.8 cm R UPJ, known chronic stone burden in past, has seen Urology back in 2018 approx for left sided stones requiring stent and procedural removal. Now he has urine consistent with UTI being treated and also X-ray KUB that confirms stone, and he has episodic renal colic, currently mild. Requesting further management kidney stone.   Orders Placed This Encounter  Procedures  . Urine Culture  . DG Abd 1 View    Standing Status:   Future    Number of Occurrences:   1    Standing Expiration Date:   12/18/2020    Order Specific Question:   Reason for Exam (SYMPTOM  OR DIAGNOSIS REQUIRED)    Answer:   right flank pain renal colic episodic 2 weeks, history of known R kidney stones    Order Specific Question:   Preferred imaging location?    Answer:   [facility]  . BASIC METABOLIC PANEL WITH GFR  . CBC with Differential/Platelet  . Ambulatory referral to Urology    Referral  Priority:   Urgent    Referral Type:   Consultation    Referral Reason:   Specialty Services Required    Requested Specialty:   Urology    Number of Visits Requested:   1  . POCT urinalysis dipstick      Meds ordered this encounter  Medications  . tamsulosin (FLOMAX) 0.4 MG CAPS capsule    Sig: Take 1 capsule (0.4 mg total) by mouth daily. For 2 weeks, may repeat as needed.    Dispense:  30 capsule    Refill:  0  . cyclobenzaprine (FLEXERIL) 10 MG tablet    Sig: Take 1 tablet (10 mg total) by mouth 3 (three) times daily as needed for muscle spasms.    Dispense:  30 tablet    Refill:  0  . cephALEXin (KEFLEX) 500 MG capsule    Sig: Take 1 capsule (500 mg total) by mouth 3 (three) times daily. For 7 days    Dispense:  21 capsule    Refill:  0     Follow up plan: Return in about 4 weeks (around 07/16/2020) for 4-6 weeks follow-up kidney stone and other follow-up, future physical when ready.  Saralyn Pilar, DO [facility] 06/18/2020, 10:19 AM

## 2020-06-18 NOTE — Progress Notes (Signed)
I attempted to call the numbers listed on file as well but got no answer

## 2020-06-19 ENCOUNTER — Ambulatory Visit: Payer: 59 | Admitting: Urology

## 2020-06-19 ENCOUNTER — Encounter: Payer: Self-pay | Admitting: Urology

## 2020-06-19 LAB — CBC WITH DIFFERENTIAL/PLATELET: MCH: 29.3 pg (ref 27.0–33.0)

## 2020-06-19 NOTE — Progress Notes (Shared)
06/19/2020  11:53 AM   Nathan Flynn 28-Jan-1986 [id number]  Referring provider: Smitty Cords, DO [address] [place],  [place] [address] No chief complaint on file.   HPI: Nathan Flynn is a 35 y.o. male who presents today with right nephrolithiasis.  On 06/01/2020, patient received a left ureteroscocy with holmium laser lithotripsy and left cystoscopy with stent replacement. He was diagnosed with a left kidney stone post-op.  The patient was seen again on 06/15/2020 by Dr. Sebastian Ache for a follow up from his nephrolithiasis/stent pull. S/p the left side was stone free. Dr. Berneice Heinrich noted a non-obstructing, significant right lower pole stone burden. He recommended that the patient focus of dietary changes to lower the risk of progression with emphasis on high citrate, low salt, and high fluid volume.  Previous cysto from 06/15/2020 noted "left urethral stent grasped, removed, inspected and intact," otherwise was normal.  UA dipstick from 06/18/2020 showed moderate hematuria, positive protein, positive urobilinogen (4.0), moderate WBC (2+), and no associated lab culture.  DG  Abd 1 View from 06/18/2020 showed 1. Suspected calculus in the region of the right renal pelvis, possibly at the right renal pelvis-right ureteropelvic junction level, measuring 1.1 x 0.8 cm. No other abnormal calcification. Moderate stool in colon. No evident bowel obstruction or free air.  Today the patient reports ***  He denies ***   1.     PMH: Past Medical History:  Diagnosis Date  . Kidney stone     Surgical History: Past Surgical History:  Procedure Laterality Date  . CYSTOSCOPY W/ URETERAL STENT PLACEMENT Left 06/01/2016   Procedure: CYSTOSCOPY WITH STENT  REPLACMENT;  Surgeon: Vanna Scotland, MD;  Location: [facility];  Service: Urology;  Laterality: Left;  . CYSTOSCOPY WITH STENT PLACEMENT Left 05/25/2016   Procedure: CYSTOSCOPY WITH STENT PLACEMENT;  Surgeon: Vanna Scotland,  MD;  Location: [facility];  Service: Urology;  Laterality: Left;  . none    . URETEROSCOPY Left 05/25/2016   Procedure: URETEROSCOPY;  Surgeon: Vanna Scotland, MD;  Location: [facility];  Service: Urology;  Laterality: Left;  . URETEROSCOPY WITH HOLMIUM LASER LITHOTRIPSY Left 06/01/2016   Procedure: URETEROSCOPY WITH HOLMIUM LASER LITHOTRIPSY;  Surgeon: Vanna Scotland, MD;  Location: [facility];  Service: Urology;  Laterality: Left;    Home Medications:  Allergies as of 06/19/2020      Reactions   Septra [sulfamethoxazole-trimethoprim]    "twin was allergic so told me not to take"      Medication List       Accurate as of June 19, 2020 11:53 AM. If you have any questions, ask your nurse or doctor.        cephALEXin 500 MG capsule Commonly known as: KEFLEX Take 1 capsule (500 mg total) by mouth 3 (three) times daily. For 7 days   cyclobenzaprine 10 MG tablet Commonly known as: FLEXERIL Take 1 tablet (10 mg total) by mouth 3 (three) times daily as needed for muscle spasms.   ibuprofen 200 MG tablet Commonly known as: ADVIL Take 200 mg by mouth as needed.   tamsulosin 0.4 MG Caps capsule Commonly known as: FLOMAX Take 1 capsule (0.4 mg total) by mouth daily. For 2 weeks, may repeat as needed.       Allergies: Allergies  Allergen Reactions  . Septra [Sulfamethoxazole-Trimethoprim]     "twin was allergic so told me not to take"    Family History: Family History  Problem Relation Age of Onset  . Heart  attack Father   . Prostate cancer Neg Hx   . Kidney cancer Neg Hx   . Bladder Cancer Neg Hx     Social History:   reports that he has never smoked. He has never used smokeless tobacco. He reports that he does not drink alcohol and does not use drugs.   Physical Exam: There were no vitals taken for this visit.  Constitutional:  Alert and oriented, No acute distress. HEENT: [place] AT, moist mucus membranes.  Trachea midline, no masses. Cardiovascular: No clubbing, cyanosis, or  edema. Respiratory: Normal respiratory effort, no increased work of breathing. Skin: No rashes, bruises or suspicious lesions. Neurologic: Grossly intact, no focal deficits, moving all 4 extremities. Psychiatric: Normal mood and affect.  Laboratory Data:  Lab Results  Component Value Date   CREATININE 0.86 06/18/2020    No results found for: PSA  No results found for: TESTOSTERONE  No results found for: HGBA1C  Urinalysis ***  Pertinent Imaging: *** Results for orders placed during the hospital encounter of 06/18/20  DG Abd 1 View  Narrative CLINICAL DATA:  Flank pain  EXAM: ABDOMEN - 1 VIEW  COMPARISON:  CT abdomen and pelvis Jul 29, 2016  FINDINGS: There is a calcification on the right lateral to the L2-3 level measuring 1.1 x 0.8 cm, located in the expected region of the right renal pelvis. No other abnormal calcifications evident. There is moderate stool in the colon. There is no bowel dilatation or air-fluid level to suggest bowel obstruction. No free air.  IMPRESSION: Suspected calculus in the region of the right renal pelvis, possibly at the right renal pelvis-right ureteropelvic junction level, measuring 1.1 x 0.8 cm. No other abnormal calcification. Moderate stool in colon. No evident bowel obstruction or free air.   Electronically Signed By: Bretta Bang III M.D. On: 06/18/2020 11:39   I have personally reviewed the images and agree with radiologist interpretation.    Results for orders placed or performed in visit on 06/18/20  BASIC METABOLIC PANEL WITH GFR  Result Value Ref Range   Glucose, Bld 89 65 - 99 mg/dL   BUN 15 7 - 25 mg/dL   Creat 6.19 5.09 - 3.26 mg/dL   GFR, Est Non African American 112 > OR = 60 mL/min/1.80m2   GFR, Est African American 129 > OR = 60 mL/min/1.99m2   BUN/Creatinine Ratio NOT APPLICABLE 6 - 22 (calc)   Sodium 140 135 - 146 mmol/L   Potassium 4.3 3.5 - 5.3 mmol/L   Chloride 102 98 - 110 mmol/L   CO2 29 20 -  32 mmol/L   Calcium 9.6 8.6 - 10.3 mg/dL  CBC with Differential/Platelet  Result Value Ref Range   WBC 5.0 3.8 - 10.8 Thousand/uL   RBC 4.84 4.20 - 5.80 Million/uL   Hemoglobin 14.2 13.2 - 17.1 g/dL   HCT 71.2 45.8 - 09.9 %   MCV 89.7 80.0 - 100.0 fL   MCH 29.3 27.0 - 33.0 pg   MCHC 32.7 32.0 - 36.0 g/dL   RDW 83.3 82.5 - 05.3 %   Platelets 237 140 - 400 Thousand/uL   MPV 12.0 7.5 - 12.5 fL   Neutro Abs 2,440 1,500 - 7,800 cells/uL   Lymphs Abs 1,845 850 - 3,900 cells/uL   Absolute Monocytes 485 200 - 950 cells/uL   Eosinophils Absolute 200 15 - 500 cells/uL   Basophils Absolute 30 0 - 200 cells/uL   Neutrophils Relative % 48.8 %   Total Lymphocyte 36.9 %  Monocytes Relative 9.7 %   Eosinophils Relative 4.0 %   Basophils Relative 0.6 %  POCT urinalysis dipstick  Result Value Ref Range   Color, UA Yellow    Clarity, UA Cloudy    Glucose, UA Negative Negative   Bilirubin, UA Negative    Ketones, UA Negative    Spec Grav, UA 1.015 1.010 - 1.025   Blood, UA Moderate ++    pH, UA 7.0 5.0 - 8.0   Protein, UA Positive (A) Negative   Urobilinogen, UA 4.0 (A) 0.2 or 1.0 E.U./dL   Nitrite, UA Positive    Leukocytes, UA Moderate (2+) (A) Negative   Appearance     Odor      No results found for any visits on 06/19/20.    Assessment & Plan:   ***   Follow Up:  No follow-ups on file.   Jonette Pesa, am acting as a scribe for Dr. Vanna Scotland.    [facility] [address], [address] [place], [place] [address] [phone]

## 2020-06-20 LAB — CBC WITH DIFFERENTIAL/PLATELET
Absolute Monocytes: 485 cells/uL (ref 200–950)
Basophils Absolute: 30 cells/uL (ref 0–200)
Basophils Relative: 0.6 %
Eosinophils Absolute: 200 cells/uL (ref 15–500)
Eosinophils Relative: 4 %
HCT: 43.4 % (ref 38.5–50.0)
Hemoglobin: 14.2 g/dL (ref 13.2–17.1)
Lymphs Abs: 1845 cells/uL (ref 850–3900)
MCHC: 32.7 g/dL (ref 32.0–36.0)
MCV: 89.7 fL (ref 80.0–100.0)
MPV: 12 fL (ref 7.5–12.5)
Monocytes Relative: 9.7 %
Neutro Abs: 2440 cells/uL (ref 1500–7800)
Neutrophils Relative %: 48.8 %
Platelets: 237 10*3/uL (ref 140–400)
RDW: 12.3 % (ref 11.0–15.0)
Total Lymphocyte: 36.9 %
WBC: 5 10*3/uL (ref 3.8–10.8)

## 2020-06-20 LAB — URINE CULTURE
MICRO NUMBER:: 11733787
SPECIMEN QUALITY:: ADEQUATE

## 2020-06-20 LAB — BASIC METABOLIC PANEL WITHOUT GFR
BUN: 15 mg/dL (ref 7–25)
CO2: 29 mmol/L (ref 20–32)
Calcium: 9.6 mg/dL (ref 8.6–10.3)
Chloride: 102 mmol/L (ref 98–110)
Creat: 0.86 mg/dL (ref 0.60–1.35)
GFR, Est African American: 129 mL/min/1.73m2
GFR, Est Non African American: 112 mL/min/1.73m2
Glucose, Bld: 89 mg/dL (ref 65–99)
Potassium: 4.3 mmol/L (ref 3.5–5.3)
Sodium: 140 mmol/L (ref 135–146)

## 2020-06-21 ENCOUNTER — Ambulatory Visit (INDEPENDENT_AMBULATORY_CARE_PROVIDER_SITE_OTHER): Payer: 59 | Admitting: Urology

## 2020-06-21 ENCOUNTER — Encounter: Payer: Self-pay | Admitting: Urology

## 2020-06-21 ENCOUNTER — Other Ambulatory Visit: Payer: Self-pay

## 2020-06-21 VITALS — BP 144/111 | HR 120 | Ht 72.0 in | Wt >= 6400 oz

## 2020-06-21 DIAGNOSIS — N200 Calculus of kidney: Secondary | ICD-10-CM

## 2020-06-21 LAB — URINALYSIS, COMPLETE
Bilirubin, UA: NEGATIVE
Glucose, UA: NEGATIVE
Ketones, UA: NEGATIVE
Nitrite, UA: NEGATIVE
Protein,UA: NEGATIVE
Specific Gravity, UA: 1.025 (ref 1.005–1.030)
Urobilinogen, Ur: 8 mg/dL — ABNORMAL HIGH (ref 0.2–1.0)
pH, UA: 6.5 (ref 5.0–7.5)

## 2020-06-21 LAB — MICROSCOPIC EXAMINATION
Bacteria, UA: NONE SEEN
RBC, Urine: >30 /hpf — AB (ref 0–2)

## 2020-06-21 NOTE — Progress Notes (Signed)
 06/21/2020 2:50 PM   Nathan Flynn 02/20/1986 [id number]  Referring provider: Karamalegos, Alexander J, DO [address] [place],  [place] [address]  Chief Complaint  Patient presents with  . Nephrolithiasis    HPI: 34-year-old male who presents today for further evaluation of right kidney stone.  He is a known remote history of kidney stones requiring ureteroscopic intervention in the past.  He did not tolerate this procedure particularly well.  About a month ago, he began developing intermittent episodes of right flank pain.  This eventually radiate down to his right lower quadrant as to his right penis.  He also had some frequency and hematuria.  All of this is resolved however he keeps having intermittent episodes of right flank pain.  He was seen and evaluated by his PCP and underwent KUB.  This indicated a 1.1 cm x 0.8 cm right UPJ stone.  At this point, is only having mild intermittent episodes of right flank pain which are not severe.  These are easily managed with finasteride.  It is no where close to the severity that he had with his previous stone episode.  He denies any fevers or chills.  He does mention that he was diagnosed with a presumed UTI by his PCP.  Urine culture from 06/19/2018 did in fact grow 100,000 colonies of E. coli.  No fevers or chills.   PMH: Past Medical History:  Diagnosis Date  . Kidney stone     Surgical History: Past Surgical History:  Procedure Laterality Date  . CYSTOSCOPY W/ URETERAL STENT PLACEMENT Left 06/01/2016   Procedure: CYSTOSCOPY WITH STENT  REPLACMENT;  Surgeon: Cleone Hulick, MD;  Location: [facility];  Service: Urology;  Laterality: Left;  . CYSTOSCOPY WITH STENT PLACEMENT Left 05/25/2016   Procedure: CYSTOSCOPY WITH STENT PLACEMENT;  Surgeon: Adan Baehr, MD;  Location: [facility];  Service: Urology;  Laterality: Left;  . none    . URETEROSCOPY Left 05/25/2016   Procedure: URETEROSCOPY;  Surgeon: Musette Kisamore, MD;   Location: [facility];  Service: Urology;  Laterality: Left;  . URETEROSCOPY WITH HOLMIUM LASER LITHOTRIPSY Left 06/01/2016   Procedure: URETEROSCOPY WITH HOLMIUM LASER LITHOTRIPSY;  Surgeon: Denita Lun, MD;  Location: [facility];  Service: Urology;  Laterality: Left;    Home Medications:  Allergies as of 06/21/2020      Reactions   Septra [sulfamethoxazole-trimethoprim]    "twin was allergic so told me not to take"      Medication List       Accurate as of June 21, 2020  2:50 PM. If you have any questions, ask your nurse or doctor.        cephALEXin 500 MG capsule Commonly known as: KEFLEX Take 1 capsule (500 mg total) by mouth 3 (three) times daily. For 7 days   cyclobenzaprine 10 MG tablet Commonly known as: FLEXERIL Take 1 tablet (10 mg total) by mouth 3 (three) times daily as needed for muscle spasms.   ibuprofen 200 MG tablet Commonly known as: ADVIL Take 200 mg by mouth as needed.   tamsulosin 0.4 MG Caps capsule Commonly known as: FLOMAX Take 1 capsule (0.4 mg total) by mouth daily. For 2 weeks, may repeat as needed.       Allergies:  Allergies  Allergen Reactions  . Septra [Sulfamethoxazole-Trimethoprim]     "twin was allergic so told me not to take"    Family History: Family History  Problem Relation Age of Onset  . Heart attack Father   .   Prostate cancer Neg Hx   . Kidney cancer Neg Hx   . Bladder Cancer Neg Hx     Social History:  reports that he has never smoked. He has never used smokeless tobacco. He reports that he does not drink alcohol and does not use drugs.   Physical Exam: BP (!) 144/111   Pulse (!) 120   Ht 6' (1.829 m)   Wt (!) 500 lb (226.8 kg)   BMI 67.81 kg/m   Constitutional:  Alert and oriented, No acute distress. HEENT: Crane AT, moist mucus membranes.  Trachea midline, no masses.  Good neck movement Cardiovascular: No clubbing, cyanosis, or edema. Respiratory: Normal respiratory effort, no increased work of breathing. GI:  Abdomen is soft, morbidly Skin: No rashes, bruises or suspicious lesions. Neurologic: Grossly intact, no focal deficits, moving all 4 extremities. Psychiatric: Normal mood and affect.  Laboratory Data: Lab Results  Component Value Date   WBC 5.0 06/18/2020   HGB 14.2 06/18/2020   HCT 43.4 06/18/2020   MCV 89.7 06/18/2020   PLT 237 06/18/2020    Lab Results  Component Value Date   CREATININE 0.86 06/18/2020   Urine culture 06/18/2020 personally reviewed  Pertinent Imaging:  DG Abd 1 View  Narrative CLINICAL DATA:  Flank pain  EXAM: ABDOMEN - 1 VIEW  COMPARISON:  CT abdomen and pelvis Jul 29, 2016  FINDINGS: There is a calcification on the right lateral to the L2-3 level measuring 1.1 x 0.8 cm, located in the expected region of the right renal pelvis. No other abnormal calcifications evident. There is moderate stool in the colon. There is no bowel dilatation or air-fluid level to suggest bowel obstruction. No free air.  IMPRESSION: Suspected calculus in the region of the right renal pelvis, possibly at the right renal pelvis-right ureteropelvic junction level, measuring 1.1 x 0.8 cm. No other abnormal calcification. Moderate stool in colon. No evident bowel obstruction or free air.   Electronically Signed By: Bretta Bang III M.D. On: 06/18/2020 11:39   Assessment & Plan:    1.  Right nephrolithiasis KUB personally reviewed  Based on the size of the stone, unfortunately is unlikely passed on its own spontaneously.  Unclear whether or not this is in the UPJ or the proximal ureter although given the intermittency and lack of severity, is likely within the renal pelvis causing a ball-valve effect.  We discussed various treatment options for urolithiasis including observation with or without medical expulsive therapy, shockwave lithotripsy (SWL), ureteroscopy and laser lithotripsy with stent placement, and percutaneous nephrolithotomy.   We discussed that  management is based on stone size, location, density, patient co-morbidities, and patient preference.    Stones <71mm in size have a >80% spontaneous passage rate. Data surrounding the use of tamsulosin for medical expulsive therapy is controversial, but meta analyses suggests it is most efficacious for distal stones between 5-32mm in size. Possible side effects include dizziness/lightheadedness, and retrograde ejaculation.   SWL has a lower stone free rate in a single procedure, but also a lower complication rate compared to ureteroscopy and avoids a stent and associated stent related symptoms. Possible complications include renal hematoma, steinstrasse, and need for additional treatment. We discussed the role of his increased skin to stone distance can lead to decreased efficacy with shockwave lithotripsy.   Ureteroscopy with laser lithotripsy and stent placement has a higher stone free rate than SWL in a single procedure, however increased complication rate including possible infection, ureteral injury, bleeding, and stent related morbidity.  Common stent related symptoms include dysuria, urgency/frequency, and flank pain.  He reports today that he did relatively poorly in the past with ureteroscopy and did not tolerate this kind.  He like to avoid this if all possible.  We discussed based on his habitus, its unclear whether or not ESWL would be effective.  Stone to skin distance as well as stone density would help potentially predict this.  He is agreeable for a stat CT scan to help further define his anatomy and make his decision on which of the above is most appropriate.  He understands that even if he chooses ESWL, the stone free rate is lower.  I will call him on Monday with his CT scan results and let us know which surgery he would like to pursue.  In the interim, we discussed warning symptoms and indications for more urgent/emergent evaluation in the emergency room.  He is agreeable this  plan.  Complete antibiotics for UTI.  - Urinalysis, Complete   Vanna Scotland, MD  [facility] [address], [address] [place], [place] [address] [phone]

## 2020-06-21 NOTE — H&P (View-Only) (Signed)
06/21/2020 2:50 PM   Nathan Flynn [birth year] [id number]  Referring provider: Smitty Cords, DO [address] [place],  [place] [address]  Chief Complaint  Patient presents with  . Nephrolithiasis    HPI: 35 year old male who presents today for further evaluation of right kidney stone.  He is a known remote history of kidney stones requiring ureteroscopic intervention in the past.  He did not tolerate this procedure particularly well.  About a month ago, he began developing intermittent episodes of right flank pain.  This eventually radiate down to his right lower quadrant as to his right penis.  He also had some frequency and hematuria.  All of this is resolved however he keeps having intermittent episodes of right flank pain.  He was seen and evaluated by his PCP and underwent KUB.  This indicated a 1.1 cm x 0.8 cm right UPJ stone.  At this point, is only having mild intermittent episodes of right flank pain which are not severe.  These are easily managed with finasteride.  It is no where close to the severity that he had with his previous stone episode.  He denies any fevers or chills.  He does mention that he was diagnosed with a presumed UTI by his PCP.  Urine culture from 06/19/2018 did in fact grow 100,000 colonies of E. coli.  No fevers or chills.   PMH: Past Medical History:  Diagnosis Date  . Kidney stone     Surgical History: Past Surgical History:  Procedure Laterality Date  . CYSTOSCOPY W/ URETERAL STENT PLACEMENT Left 06/01/2016   Procedure: CYSTOSCOPY WITH STENT  REPLACMENT;  Surgeon: Vanna Scotland, MD;  Location: [facility];  Service: Urology;  Laterality: Left;  . CYSTOSCOPY WITH STENT PLACEMENT Left 05/25/2016   Procedure: CYSTOSCOPY WITH STENT PLACEMENT;  Surgeon: Vanna Scotland, MD;  Location: [facility];  Service: Urology;  Laterality: Left;  . none    . URETEROSCOPY Left 05/25/2016   Procedure: URETEROSCOPY;  Surgeon: Vanna Scotland, MD;   Location: [facility];  Service: Urology;  Laterality: Left;  . URETEROSCOPY WITH HOLMIUM LASER LITHOTRIPSY Left 06/01/2016   Procedure: URETEROSCOPY WITH HOLMIUM LASER LITHOTRIPSY;  Surgeon: Vanna Scotland, MD;  Location: [facility];  Service: Urology;  Laterality: Left;    Home Medications:  Allergies as of 06/21/2020      Reactions   Septra [sulfamethoxazole-trimethoprim]    "twin was allergic so told me not to take"      Medication List       Accurate as of June 21, 2020  2:50 PM. If you have any questions, ask your nurse or doctor.        cephALEXin 500 MG capsule Commonly known as: KEFLEX Take 1 capsule (500 mg total) by mouth 3 (three) times daily. For 7 days   cyclobenzaprine 10 MG tablet Commonly known as: FLEXERIL Take 1 tablet (10 mg total) by mouth 3 (three) times daily as needed for muscle spasms.   ibuprofen 200 MG tablet Commonly known as: ADVIL Take 200 mg by mouth as needed.   tamsulosin 0.4 MG Caps capsule Commonly known as: FLOMAX Take 1 capsule (0.4 mg total) by mouth daily. For 2 weeks, may repeat as needed.       Allergies:  Allergies  Allergen Reactions  . Septra [Sulfamethoxazole-Trimethoprim]     "twin was allergic so told me not to take"    Family History: Family History  Problem Relation Age of Onset  . Heart attack Father   .  Prostate cancer Neg Hx   . Kidney cancer Neg Hx   . Bladder Cancer Neg Hx     Social History:  reports that he has never smoked. He has never used smokeless tobacco. He reports that he does not drink alcohol and does not use drugs.   Physical Exam: BP (!) 144/111   Pulse (!) 120   Ht 6' (1.829 m)   Wt (!) 500 lb (226.8 kg)   BMI 67.81 kg/m   Constitutional:  Alert and oriented, No acute distress. HEENT: Crane AT, moist mucus membranes.  Trachea midline, no masses.  Good neck movement Cardiovascular: No clubbing, cyanosis, or edema. Respiratory: Normal respiratory effort, no increased work of breathing. GI:  Abdomen is soft, morbidly Skin: No rashes, bruises or suspicious lesions. Neurologic: Grossly intact, no focal deficits, moving all 4 extremities. Psychiatric: Normal mood and affect.  Laboratory Data: Lab Results  Component Value Date   WBC 5.0 06/18/2020   HGB 14.2 06/18/2020   HCT 43.4 06/18/2020   MCV 89.7 06/18/2020   PLT 237 06/18/2020    Lab Results  Component Value Date   CREATININE 0.86 06/18/2020   Urine culture 06/18/2020 personally reviewed  Pertinent Imaging:  DG Abd 1 View  Narrative CLINICAL DATA:  Flank pain  EXAM: ABDOMEN - 1 VIEW  COMPARISON:  CT abdomen and pelvis Jul 29, 2016  FINDINGS: There is a calcification on the right lateral to the L2-3 level measuring 1.1 x 0.8 cm, located in the expected region of the right renal pelvis. No other abnormal calcifications evident. There is moderate stool in the colon. There is no bowel dilatation or air-fluid level to suggest bowel obstruction. No free air.  IMPRESSION: Suspected calculus in the region of the right renal pelvis, possibly at the right renal pelvis-right ureteropelvic junction level, measuring 1.1 x 0.8 cm. No other abnormal calcification. Moderate stool in colon. No evident bowel obstruction or free air.   Electronically Signed By: Bretta Bang III M.D. On: 06/18/2020 11:39   Assessment & Plan:    1.  Right nephrolithiasis KUB personally reviewed  Based on the size of the stone, unfortunately is unlikely passed on its own spontaneously.  Unclear whether or not this is in the UPJ or the proximal ureter although given the intermittency and lack of severity, is likely within the renal pelvis causing a ball-valve effect.  We discussed various treatment options for urolithiasis including observation with or without medical expulsive therapy, shockwave lithotripsy (SWL), ureteroscopy and laser lithotripsy with stent placement, and percutaneous nephrolithotomy.   We discussed that  management is based on stone size, location, density, patient co-morbidities, and patient preference.    Stones <71mm in size have a >80% spontaneous passage rate. Data surrounding the use of tamsulosin for medical expulsive therapy is controversial, but meta analyses suggests it is most efficacious for distal stones between 5-32mm in size. Possible side effects include dizziness/lightheadedness, and retrograde ejaculation.   SWL has a lower stone free rate in a single procedure, but also a lower complication rate compared to ureteroscopy and avoids a stent and associated stent related symptoms. Possible complications include renal hematoma, steinstrasse, and need for additional treatment. We discussed the role of his increased skin to stone distance can lead to decreased efficacy with shockwave lithotripsy.   Ureteroscopy with laser lithotripsy and stent placement has a higher stone free rate than SWL in a single procedure, however increased complication rate including possible infection, ureteral injury, bleeding, and stent related morbidity.  Common stent related symptoms include dysuria, urgency/frequency, and flank pain.  He reports today that he did relatively poorly in the past with ureteroscopy and did not tolerate this kind.  He like to avoid this if all possible.  We discussed based on his habitus, its unclear whether or not ESWL would be effective.  Stone to skin distance as well as stone density would help potentially predict this.  He is agreeable for a stat CT scan to help further define his anatomy and make his decision on which of the above is most appropriate.  He understands that even if he chooses ESWL, the stone free rate is lower.  I will call him on Monday with his CT scan results and let us know which surgery he would like to pursue.  In the interim, we discussed warning symptoms and indications for more urgent/emergent evaluation in the emergency room.  He is agreeable this  plan.  Complete antibiotics for UTI.  - Urinalysis, Complete   Vanna Scotland, MD  [facility] [address], [address] [place], [place] [address] [phone]

## 2020-06-25 ENCOUNTER — Ambulatory Visit
Admission: RE | Admit: 2020-06-25 | Discharge: 2020-06-25 | Disposition: A | Discharge status: Home / Self Care | Payer: 59 | Source: Ambulatory Visit | Attending: Urology | Admitting: Urology

## 2020-06-25 ENCOUNTER — Other Ambulatory Visit: Payer: Self-pay

## 2020-06-25 DIAGNOSIS — N200 Calculus of kidney: Secondary | ICD-10-CM | POA: Insufficient documentation

## 2020-06-26 ENCOUNTER — Telehealth: Payer: Self-pay | Admitting: Urology

## 2020-06-26 NOTE — Telephone Encounter (Signed)
LMOM re: CT scan results.  Asked him to return call to discuss options.    Vanna Scotland, MD

## 2020-06-26 NOTE — Telephone Encounter (Signed)
I was able to get in touch with the patient.  He is most interested in ESWL despite understanding the day of stone skin distance is 22 cm and 1100 Hounsfield units.  He was quoted about a 20% chance of efficacy.  He was most interested in ESWL at the end of our conversation.  We discussed risk and benefits.  He understands that this fails, we will proceed to ureteroscopy.  After discussion, I did discuss with [place] stone in the weight limit of the bed is 480.  He currently weighs 500 pounds.  As such, we will have to proceed with ureteroscopy.  Vanna Scotland, MD

## 2020-07-01 ENCOUNTER — Other Ambulatory Visit: Payer: Self-pay | Admitting: Urology

## 2020-07-01 DIAGNOSIS — N200 Calculus of kidney: Secondary | ICD-10-CM

## 2020-07-05 ENCOUNTER — Other Ambulatory Visit: Payer: Self-pay | Admitting: Urology

## 2020-07-08 ENCOUNTER — Other Ambulatory Visit
Admission: RE | Admit: 2020-07-08 | Discharge: 2020-07-08 | Disposition: A | Discharge status: Home / Self Care | Payer: 59 | Source: Ambulatory Visit | Attending: Urology | Admitting: Urology

## 2020-07-08 ENCOUNTER — Other Ambulatory Visit: Payer: Self-pay

## 2020-07-08 DIAGNOSIS — Z01810 Encounter for preprocedural cardiovascular examination: Secondary | ICD-10-CM | POA: Diagnosis not present

## 2020-07-08 HISTORY — DX: Personal history of urinary calculi: Z87.442

## 2020-07-08 NOTE — Patient Instructions (Addendum)
Your procedure is scheduled on: 07/15/20 Report to DAY SURGERY DEPARTMENT LOCATED ON 2ND FLOOR MEDICAL MALL ENTRANCE. To find out your arrival time please call [phone] between 1PM - 3PM on 07/12/20.  Remember: Instructions that are not followed completely may result in serious medical risk, up to and including death, or upon the discretion of your surgeon and anesthesiologist your surgery may need to be rescheduled.     _X__ 1. Do not eat food or drink any liquids after midnight the night before your procedure.                 No gum chewing or hard candies.   __X__2.  On the morning of surgery brush your teeth with toothpaste and water, you                 may rinse your mouth with mouthwash if you wish.  Do not swallow any              toothpaste of mouthwash.     _X__ 3.  No Alcohol for 24 hours before or after surgery.   _X__ 4.  Do Not Smoke or use e-cigarettes For 24 Hours Prior to Your Surgery.                 Do not use any chewable tobacco products for at least 6 hours prior to                 surgery.  ____  5.  Bring all medications with you on the day of surgery if instructed.   __X__  6.  Notify your doctor if there is any change in your medical condition      (cold, fever, infections).     Do not wear jewelry, make-up, hairpins, clips or nail polish. Do not wear lotions, powders, or perfumes.  Do not shave 48 hours prior to surgery. Men may shave face and neck. Do not bring valuables to the hospital.    [facility] is not responsible for any belongings or valuables.  Contacts, dentures/partials or body piercings may not be worn into surgery. Bring a case for your contacts, glasses or hearing aids, a denture cup will be supplied. Leave your suitcase in the car. After surgery it may be brought to your room. For patients admitted to the hospital, discharge time is determined by your treatment team.   Patients discharged the day of surgery will not be allowed to drive  home.   Please read over the following fact sheets that you were given:     __X__ Take these medicines the morning of surgery with A SIP OF WATER:    1. none  2.   3.   4.  5.  6.  ____ Fleet Enema (as directed)   ____ Use CHG Soap/SAGE wipes as directed  ____ Use inhalers on the day of surgery  ____ Stop metformin/Janumet/Farxiga 2 days prior to surgery    ____ Take 1/2 of usual insulin dose the night before surgery. No insulin the morning          of surgery.   ____ Stop Blood Thinners Coumadin/Plavix/Xarelto/Pleta/Pradaxa/Eliquis/Effient/Aspirin  on   Or contact your Surgeon, Cardiologist or Medical Doctor regarding  ability to stop your blood thinners  __X__ Stop Anti-inflammatories 7 days before surgery such as Advil, Ibuprofen, Motrin,  BC or Goodies Powder, Naprosyn, Naproxen, Aleve, Aspirin    __X__ Stop all herbal supplements, fish oil or vitamin E until  after surgery.    ____ Bring C-Pap to the hospital.

## 2020-07-08 NOTE — Progress Notes (Signed)
  Perioperative Services Pre-Admission/Anesthesia Testing    Date: 07/08/20  Name: Nathan Flynn MRN:   [id number]  Re: Presurgical airway assessment   Case: [id number] Date/Time: 07/15/20 0715   Procedure: CYSTOSCOPY/URETEROSCOPY/HOLMIUM LASER/STENT PLACEMENT (Right )   Anesthesia type: General   Pre-op diagnosis: Right Kidney [name]   Location: [facility] OR ROOM 10 / [facility]   Surgeons: Vanna Scotland, MD    Patient scheduled for the above procedure on 07/15/2020 with Dr. Vanna Scotland.  Patient presented to PAT on 07/08/2020 for presurgical interview and testing.  Patient noted to have a BMI of 68.49 kg/m.  PAT APP was called to the room for airway assessment.   Cervical range of motion: FULL  Previous neck injuries/surgeries: NONE  Thyromental distance: >3FB  Mallampati: II  Previous intubation difficulties: NONE  Last anesthetic course: GA here at [facility] on 06/01/2016 (ASA II) with no documented complications  Quentin Mulling, MSN, APRN, FNP-C, CEN [facility]  Peri-operative Services Nurse Practitioner Phone: [phone] 07/08/20 11:30 AM

## 2020-07-11 ENCOUNTER — Other Ambulatory Visit: Payer: Self-pay

## 2020-07-11 ENCOUNTER — Other Ambulatory Visit
Admission: RE | Admit: 2020-07-11 | Discharge: 2020-07-11 | Disposition: A | Discharge status: Home / Self Care | Payer: 59 | Source: Ambulatory Visit | Attending: Urology | Admitting: Urology

## 2020-07-11 DIAGNOSIS — Z20822 Contact with and (suspected) exposure to covid-19: Secondary | ICD-10-CM | POA: Diagnosis not present

## 2020-07-11 DIAGNOSIS — Z01812 Encounter for preprocedural laboratory examination: Secondary | ICD-10-CM | POA: Insufficient documentation

## 2020-07-11 LAB — SARS CORONAVIRUS 2 (TAT 6-24 HRS): SARS Coronavirus 2: NEGATIVE

## 2020-07-15 ENCOUNTER — Encounter
Admission: RE | Disposition: A | Discharge status: Home / Self Care | Payer: Self-pay | Source: Home / Self Care | Attending: Urology

## 2020-07-15 ENCOUNTER — Ambulatory Visit
Admission: RE | Admit: 2020-07-15 | Discharge: 2020-07-15 | Disposition: A | Discharge status: Home / Self Care | Payer: 59 | Attending: Urology | Admitting: Urology

## 2020-07-15 ENCOUNTER — Encounter: Payer: Self-pay | Admitting: Urology

## 2020-07-15 ENCOUNTER — Ambulatory Visit: Payer: 59

## 2020-07-15 ENCOUNTER — Ambulatory Visit: Payer: 59 | Admitting: Urgent Care

## 2020-07-15 ENCOUNTER — Other Ambulatory Visit: Payer: Self-pay

## 2020-07-15 DIAGNOSIS — N200 Calculus of kidney: Secondary | ICD-10-CM | POA: Diagnosis not present

## 2020-07-15 DIAGNOSIS — N201 Calculus of ureter: Secondary | ICD-10-CM | POA: Diagnosis not present

## 2020-07-15 DIAGNOSIS — Z87442 Personal history of urinary calculi: Secondary | ICD-10-CM | POA: Insufficient documentation

## 2020-07-15 HISTORY — PX: CYSTOSCOPY/URETEROSCOPY/HOLMIUM LASER/STENT PLACEMENT: SHX6546

## 2020-07-15 SURGERY — CYSTOSCOPY/URETEROSCOPY/HOLMIUM LASER/STENT PLACEMENT
Anesthesia: General | Site: Ureter | Laterality: Right

## 2020-07-15 MED ORDER — FENTANYL CITRATE (PF) 100 MCG/2ML IJ SOLN
25.0000 ug | INTRAMUSCULAR | Status: DC | PRN
  Administered 2020-07-15: 25 ug via INTRAVENOUS

## 2020-07-15 MED ORDER — ROCURONIUM BROMIDE 100 MG/10ML IV SOLN
INTRAVENOUS | Status: DC | PRN
  Administered 2020-07-15: 50 mg via INTRAVENOUS

## 2020-07-15 MED ORDER — OXYCODONE-ACETAMINOPHEN 5-325 MG PO TABS: 1.0000 | ORAL_TABLET | ORAL | 0 refills | Status: DC | PRN

## 2020-07-15 MED ORDER — FENTANYL CITRATE (PF) 100 MCG/2ML IJ SOLN
INTRAMUSCULAR | Status: AC
  Filled 2020-07-15: qty 2

## 2020-07-15 MED ORDER — SUCCINYLCHOLINE CHLORIDE 20 MG/ML IJ SOLN
INTRAMUSCULAR | Status: DC | PRN
  Administered 2020-07-15: 200 mg via INTRAVENOUS

## 2020-07-15 MED ORDER — PROPOFOL 10 MG/ML IV BOLUS
INTRAVENOUS | Status: AC
  Filled 2020-07-15: qty 40

## 2020-07-15 MED ORDER — MIDAZOLAM HCL 2 MG/2ML IJ SOLN
INTRAMUSCULAR | Status: AC
  Filled 2020-07-15: qty 2

## 2020-07-15 MED ORDER — ONDANSETRON HCL 4 MG/2ML IJ SOLN
INTRAMUSCULAR | Status: DC | PRN
  Administered 2020-07-15: 4 mg via INTRAVENOUS

## 2020-07-15 MED ORDER — FAMOTIDINE 20 MG PO TABS: 20.0000 mg | ORAL_TABLET | Freq: Once | ORAL | Status: AC

## 2020-07-15 MED ORDER — PROPOFOL 10 MG/ML IV BOLUS
INTRAVENOUS | Status: DC | PRN
  Administered 2020-07-15: 200 mg via INTRAVENOUS

## 2020-07-15 MED ORDER — CEFAZOLIN IN SODIUM CHLORIDE 3-0.9 GM/100ML-% IV SOLN
3.0000 g | INTRAVENOUS | Status: DC
  Filled 2020-07-15 (×2): qty 100

## 2020-07-15 MED ORDER — FENTANYL CITRATE (PF) 100 MCG/2ML IJ SOLN
INTRAMUSCULAR | Status: AC
  Administered 2020-07-15: 25 ug via INTRAVENOUS
  Filled 2020-07-15: qty 2

## 2020-07-15 MED ORDER — FAMOTIDINE 20 MG PO TABS
ORAL_TABLET | ORAL | Status: AC
  Administered 2020-07-15: 20 mg via ORAL
  Filled 2020-07-15: qty 1

## 2020-07-15 MED ORDER — MIDAZOLAM HCL 2 MG/2ML IJ SOLN
INTRAMUSCULAR | Status: DC | PRN
  Administered 2020-07-15: 2 mg via INTRAVENOUS

## 2020-07-15 MED ORDER — TAMSULOSIN HCL 0.4 MG PO CAPS: 0.4000 mg | ORAL_CAPSULE | Freq: Every day | ORAL | 0 refills | Status: DC

## 2020-07-15 MED ORDER — CHLORHEXIDINE GLUCONATE 0.12 % MT SOLN
OROMUCOSAL | Status: AC
  Administered 2020-07-15: 15 mL via OROMUCOSAL
  Filled 2020-07-15: qty 15

## 2020-07-15 MED ORDER — IOPAMIDOL (ISOVUE-200) INJECTION 41%
INTRAVENOUS | Status: DC | PRN
  Administered 2020-07-15: 6 mL via INTRAVENOUS

## 2020-07-15 MED ORDER — ACETAMINOPHEN 10 MG/ML IV SOLN
INTRAVENOUS | Status: AC
  Filled 2020-07-15: qty 100

## 2020-07-15 MED ORDER — ONDANSETRON HCL 4 MG/2ML IJ SOLN
INTRAMUSCULAR | Status: AC
  Filled 2020-07-15: qty 2

## 2020-07-15 MED ORDER — OXYBUTYNIN CHLORIDE 5 MG PO TABS
ORAL_TABLET | ORAL | Status: AC
  Administered 2020-07-15: 5 mg
  Filled 2020-07-15: qty 1

## 2020-07-15 MED ORDER — LIDOCAINE HCL (CARDIAC) PF 100 MG/5ML IV SOSY
PREFILLED_SYRINGE | INTRAVENOUS | Status: DC | PRN
  Administered 2020-07-15: 100 mg via INTRAVENOUS

## 2020-07-15 MED ORDER — DEXMEDETOMIDINE (PRECEDEX) IN NS 20 MCG/5ML (4 MCG/ML) IV SYRINGE
PREFILLED_SYRINGE | INTRAVENOUS | Status: AC
  Filled 2020-07-15: qty 5

## 2020-07-15 MED ORDER — OXYCODONE-ACETAMINOPHEN 5-325 MG PO TABS
ORAL_TABLET | ORAL | Status: AC
  Filled 2020-07-15: qty 1

## 2020-07-15 MED ORDER — LACTATED RINGERS IV SOLN: INTRAVENOUS | Status: DC

## 2020-07-15 MED ORDER — KETOROLAC TROMETHAMINE 30 MG/ML IJ SOLN
INTRAMUSCULAR | Status: DC | PRN
  Administered 2020-07-15: 30 mg via INTRAVENOUS

## 2020-07-15 MED ORDER — SUGAMMADEX SODIUM 200 MG/2ML IV SOLN
INTRAVENOUS | Status: DC | PRN
  Administered 2020-07-15: 500 mg via INTRAVENOUS

## 2020-07-15 MED ORDER — ACETAMINOPHEN 10 MG/ML IV SOLN
INTRAVENOUS | Status: DC | PRN
  Administered 2020-07-15: 1000 mg via INTRAVENOUS

## 2020-07-15 MED ORDER — ONDANSETRON HCL 4 MG/2ML IJ SOLN
4.0000 mg | Freq: Once | INTRAMUSCULAR | Status: AC
  Administered 2020-07-15: 4 mg via INTRAVENOUS

## 2020-07-15 MED ORDER — CHLORHEXIDINE GLUCONATE 0.12 % MT SOLN: 15.0000 mL | Freq: Once | OROMUCOSAL | Status: AC

## 2020-07-15 MED ORDER — DEXTROSE 5 % IV SOLN
INTRAVENOUS | Status: DC | PRN
  Administered 2020-07-15: 3 g via INTRAVENOUS

## 2020-07-15 MED ORDER — OXYBUTYNIN CHLORIDE 5 MG PO TABS: 5.0000 mg | ORAL_TABLET | Freq: Three times a day (TID) | ORAL | 0 refills | Status: DC | PRN

## 2020-07-15 MED ORDER — FENTANYL CITRATE (PF) 100 MCG/2ML IJ SOLN
INTRAMUSCULAR | Status: DC | PRN
  Administered 2020-07-15 (×2): 50 ug via INTRAVENOUS

## 2020-07-15 MED ORDER — ORAL CARE MOUTH RINSE: 15.0000 mL | Freq: Once | OROMUCOSAL | Status: AC

## 2020-07-15 MED ORDER — DEXMEDETOMIDINE (PRECEDEX) IN NS 20 MCG/5ML (4 MCG/ML) IV SYRINGE
PREFILLED_SYRINGE | INTRAVENOUS | Status: DC | PRN
  Administered 2020-07-15: 10 ug via INTRAVENOUS

## 2020-07-15 MED ORDER — DEXAMETHASONE SODIUM PHOSPHATE 10 MG/ML IJ SOLN
INTRAMUSCULAR | Status: DC | PRN
  Administered 2020-07-15: 10 mg via INTRAVENOUS

## 2020-07-15 MED ORDER — OXYCODONE-ACETAMINOPHEN 5-325 MG PO TABS
1.0000 | ORAL_TABLET | Freq: Once | ORAL | Status: AC
  Administered 2020-07-15: 1 via ORAL

## 2020-07-15 SURGICAL SUPPLY — 30 items
BAG DRAIN CYSTO-URO LG1000N (MISCELLANEOUS) ×2 IMPLANT
BASKET ZERO TIP 1.9FR (BASKET) IMPLANT
BRUSH SCRUB EZ 1% IODOPHOR (MISCELLANEOUS) ×2 IMPLANT
BSKT STON RTRVL ZERO TP 1.9FR (BASKET)
CATH ROBINSON RED A/P 16FR (CATHETERS) ×2 IMPLANT
CATH URET FLEX-TIP 2 LUMEN 10F (CATHETERS) IMPLANT
CATH URETL 5X70 OPEN END (CATHETERS) ×2 IMPLANT
CNTNR SPEC 2.5X3XGRAD LEK (MISCELLANEOUS)
CONT SPEC 4OZ STER OR WHT (MISCELLANEOUS)
CONT SPEC 4OZ STRL OR WHT (MISCELLANEOUS)
CONTAINER SPEC 2.5X3XGRAD LEK (MISCELLANEOUS) IMPLANT
DRAPE UTILITY 15X26 TOWEL STRL (DRAPES) ×2 IMPLANT
GLOVE SURG ENC MOIS LTX SZ6.5 (GLOVE) ×2 IMPLANT
GOWN STRL REUS W/ TWL LRG LVL3 (GOWN DISPOSABLE) ×2 IMPLANT
GOWN STRL REUS W/TWL LRG LVL3 (GOWN DISPOSABLE) ×4
GUIDEWIRE GREEN .038 145CM (MISCELLANEOUS) ×2 IMPLANT
GUIDEWIRE STR DUAL SENSOR (WIRE) ×2 IMPLANT
INFUSOR MANOMETER BAG 3000ML (MISCELLANEOUS) ×2 IMPLANT
IV NS IRRIG 3000ML ARTHROMATIC (IV SOLUTION) ×2 IMPLANT
KIT TURNOVER CYSTO (KITS) ×2 IMPLANT
MANIFOLD NEPTUNE II (INSTRUMENTS) ×2 IMPLANT
PACK CYSTO AR (MISCELLANEOUS) ×2 IMPLANT
SET CYSTO W/LG BORE CLAMP LF (SET/KITS/TRAYS/PACK) ×2 IMPLANT
SHEATH URETERAL 12FR 45CM (SHEATH) ×2 IMPLANT
SHEATH URETERAL 12FRX35CM (MISCELLANEOUS) IMPLANT
STENT URET 6FRX24 CONTOUR (STENTS) IMPLANT
STENT URET 6FRX26 CONTOUR (STENTS) ×2 IMPLANT
SURGILUBE 2OZ TUBE FLIPTOP (MISCELLANEOUS) ×2 IMPLANT
TRACTIP FLEXIVA PULSE ID 200 (Laser) ×2 IMPLANT
WATER STERILE IRR 1000ML POUR (IV SOLUTION) ×2 IMPLANT

## 2020-07-15 NOTE — Interval H&P Note (Signed)
H&P up-to-date  No changes  Regular rate and rhythm, clear to auscultation bilaterally  All questions answered.  Consent confirmed.

## 2020-07-15 NOTE — Discharge Instructions (Signed)
AMBULATORY SURGERY  DISCHARGE INSTRUCTIONS   1) The drugs that you were given will stay in your system until tomorrow so for the next 24 hours you should not:  A) Drive an automobile B) Make any legal decisions C) Drink any alcoholic beverage   2) You may resume regular meals tomorrow.  Today it is better to start with liquids and gradually work up to solid foods.  You may eat anything you prefer, but it is better to start with liquids, then soup and crackers, and gradually work up to solid foods.   3) Please notify your doctor immediately if you have any unusual bleeding, trouble breathing, redness and pain at the surgery site, drainage, fever, or pain not relieved by medication.    4) Additional Instructions:        Please contact your physician with any problems or Same Day Surgery at [phone], Monday through Friday 6 am to 4 pm, or [facility] number at [phone].You have a ureteral stent in place.  This is a tube that extends from your kidney to your bladder.  This may cause urinary bleeding, burning with urination, and urinary frequency.  Please call our office or present to the ED if you develop fevers >101 or pain which is not able to be controlled with oral pain medications.  You may be given either Flomax and/ or ditropan to help with bladder spasms and stent pain in addition to pain medications.    [facility] [address], [address] [place], [place] [address] [phone]  

## 2020-07-15 NOTE — Op Note (Signed)
Date of procedure: 07/15/20  Preoperative diagnosis:  1.  Right renal pelvic stone  Postoperative diagnosis:  1.  same  Procedure: 1. Right ureteroscopy 2. Right retrograde pyelogram 3. Right ureteral stent placement  Surgeon: Hollice Espy, MD  Anesthesia: General  Complications: None  Intraoperative findings: Able to advance scope to the right proximal ureter but not beyond.  Stone seen easily on x-ray.  Stent placed with plans for staged procedure.  EBL: Minimal  Specimens: None  Drains: 6 x 26 French double-J ureteral stent on right  Indication: Nathan Flynn is a 35 y.o. patient with right renal pelvic stone.  After reviewing the management options for treatment, he elected to proceed with the above surgical procedure(s). We have discussed the potential benefits and risks of the procedure, side effects of the proposed treatment, the likelihood of the patient achieving the goals of the procedure, and any potential problems that might occur during the procedure or recuperation. Informed consent has been obtained.  Description of procedure:  The patient was taken to the operating room and general anesthesia was induced.  The patient was placed in the dorsal lithotomy position, prepped and draped in the usual sterile fashion, and preoperative antibiotics were administered. A preoperative time-out was performed.   A 21 French cystoscope was advanced per urethra into his bladder.  Of note, due to his habitus, this was quite difficult with significant torque on the scope.  Eventually, I was able to identify the right UO.  This was intubated with a 5 [place] open-ended ureteral catheter and gentle retrograde pyelogram was performed.  Notably, the stone could be seen easily on scout imaging.  Retrograde was unremarkable with a filling defect at the level of the stone.  Wire was then placed up to the level of the kidney.  To use a dual-lumen introducer to introduce a second  Super Stiff wire into the kidney.  I then attempted to advance an access sheath, Lacinda Axon 12/14 [place] and was actually able to get it to the mid ureter without difficulty but not beyond.  Again, the angles due to his habitus were somewhat distorted.  I remove the inner cannula and advance a 7 [place] digital flexible ureteroscope through the access sheath up to the proximal ureter where I met resistance.  I tried to use a railroad technique to advance the scope further but ultimately was unsuccessful.  At this point in time, I elected to abort my attempt to treat the stone and plan for staged ureteroscopy with passive dilation.  The scope was backed down the length of the ureter inspecting along the way and there were no ureteral injuries appreciated.  I then placed a 6 x 26 French stent fluoroscopically with a curl within the renal pelvis and a curl within the bladder once appropriately positioned.  I drained his bladder with a red rubber.  The patient was then positioned back on the stretcher and extubated.  He was taken to the PACU in stable condition.  Plan: We will return for staged ureteroscopic procedure.  Hollice Espy, M.D.

## 2020-07-15 NOTE — Anesthesia Postprocedure Evaluation (Signed)
Anesthesia Post Note  Patient: Nathan Flynn  Procedure(s) Performed: CYSTOSCOPY/URETEROSCOPY/STENT PLACEMENT (Right Ureter)  Patient location during evaluation: PACU Anesthesia Type: General Level of consciousness: awake and alert Pain management: pain level controlled Vital Signs Assessment: post-procedure vital signs reviewed and stable Respiratory status: spontaneous breathing, nonlabored ventilation, respiratory function stable and patient connected to nasal cannula oxygen Cardiovascular status: blood pressure returned to baseline and stable Postop Assessment: no apparent nausea or vomiting Anesthetic complications: no   No complications documented.   Last Vitals:  Vitals:   07/15/20 0958 07/15/20 1000  BP:  (!) 141/79  Pulse: 84   Resp: (!) 21   Temp: (!) 36.1 C   SpO2: 94%     Last Pain:  Vitals:   07/15/20 0958  TempSrc: Temporal  PainSc: 4                  Corinda Gubler

## 2020-07-15 NOTE — Anesthesia Procedure Notes (Signed)
Procedure Name: Intubation Date/Time: 07/15/2020 7:52 AM Performed by: Reece Agar, CRNA Pre-anesthesia Checklist: Patient identified, Emergency Drugs available, Suction available and Patient being monitored Patient Re-evaluated:Patient Re-evaluated prior to induction Oxygen Delivery Method: Circle system utilized Preoxygenation: Pre-oxygenation with 100% oxygen Induction Type: IV induction Laryngoscope Size: McGraph and 4 Grade View: Grade I Tube type: Oral Tube size: 7.5 mm Number of attempts: 1 Airway Equipment and Method: Stylet and Oral airway Placement Confirmation: ETT inserted through vocal cords under direct vision,  positive ETCO2 and breath sounds checked- equal and bilateral Secured at: 23 cm Tube secured with: Tape Dental Injury: Teeth and Oropharynx as per pre-operative assessment

## 2020-07-15 NOTE — Anesthesia Preprocedure Evaluation (Signed)
Anesthesia Evaluation  Patient identified by MRN, date of birth, ID band Patient awake    Reviewed: Allergy & Precautions, NPO status , Patient's Chart, lab work & pertinent test results  History of Anesthesia Complications Negative for: history of anesthetic complications  Airway Mallampati: II  TM Distance: >3 FB Neck ROM: Full    Dental no notable dental hx. (+) Teeth Intact   Pulmonary neg pulmonary ROS, neg sleep apnea, neg COPD, Patient abstained from smoking.Not current smoker,    Pulmonary exam normal breath sounds clear to auscultation       Cardiovascular Exercise Tolerance: Good METS(-) hypertension(-) CAD and (-) Past MI negative cardio ROS  (-) dysrhythmias  Rhythm:Regular Rate:Normal - Systolic murmurs    Neuro/Psych negative neurological ROS  negative psych ROS   GI/Hepatic neg GERD  ,(+)     (-) substance abuse  ,   Endo/Other  neg diabetesMorbid obesity  Renal/GU negative Renal ROS     Musculoskeletal   Abdominal (+) + obese,   Peds  Hematology   Anesthesia Other Findings Past Medical History: No date: History of kidney stones No date: Kidney stone  Reproductive/Obstetrics                             Anesthesia Physical Anesthesia Plan  ASA: III  Anesthesia Plan: General   Post-op Pain Management:    Induction: Intravenous  PONV Risk Score and Plan: 3 and Ondansetron, Dexamethasone, Midazolam and Treatment may vary due to age or medical condition  Airway Management Planned: Oral ETT and Video Laryngoscope Planned  Additional Equipment: None  Intra-op Plan:   Post-operative Plan: Extubation in OR  Informed Consent: I have reviewed the patients History and Physical, chart, labs and discussed the procedure including the risks, benefits and alternatives for the proposed anesthesia with the patient or authorized representative who has indicated his/her  understanding and acceptance.     Dental advisory given  Plan Discussed with: CRNA and Surgeon  Anesthesia Plan Comments: (Discussed risks of anesthesia with patient, including PONV, sore throat, lip/dental damage. Rare risks discussed as well, such as cardiorespiratory and neurological sequelae. Patient understands. Patient informed about increased incidence of above perioperative risk due to high BMI. Patient understands.  Prior uneventful intubation.)        Anesthesia Quick Evaluation

## 2020-07-15 NOTE — Transfer of Care (Signed)
Immediate Anesthesia Transfer of Care Note  Patient: Nathan Flynn  Procedure(s) Performed: CYSTOSCOPY/URETEROSCOPY/STENT PLACEMENT (Right Ureter)  Patient Location: PACU  Anesthesia Type:General  Level of Consciousness: drowsy  Airway & Oxygen Therapy: Patient Spontanous Breathing and Patient connected to face mask oxygen  Post-op Assessment: Report given to RN and Post -op Vital signs reviewed and stable  Post vital signs: Reviewed and stable  Last Vitals:  Vitals Value Taken Time  BP 119/73 07/15/20 0844  Temp    Pulse 83 07/15/20 0851  Resp 15 07/15/20 0851  SpO2 95 % 07/15/20 0851  Vitals shown include unvalidated device data.  Last Pain:  Vitals:   07/15/20 0628  TempSrc: Temporal  PainSc: 0-No pain      Patients Stated Pain Goal: 0 (07/15/20 1638)  Complications: No complications documented.

## 2020-07-16 ENCOUNTER — Telehealth: Payer: Self-pay

## 2020-07-16 ENCOUNTER — Encounter: Payer: Self-pay | Admitting: Urology

## 2020-07-16 MED ORDER — ONDANSETRON 4 MG PO TBDP: 4.0000 mg | ORAL_TABLET | Freq: Three times a day (TID) | ORAL | 0 refills | Status: DC | PRN

## 2020-07-16 NOTE — Telephone Encounter (Signed)
See my chart message. Sent in zofran for nausea. Pt aware.

## 2020-07-17 ENCOUNTER — Other Ambulatory Visit: Payer: Self-pay | Admitting: Urology

## 2020-07-17 DIAGNOSIS — N200 Calculus of kidney: Secondary | ICD-10-CM

## 2020-07-18 ENCOUNTER — Other Ambulatory Visit: Payer: 59

## 2020-07-22 ENCOUNTER — Ambulatory Visit: Payer: 59 | Admitting: Registered Nurse

## 2020-07-22 ENCOUNTER — Ambulatory Visit
Admission: RE | Admit: 2020-07-22 | Discharge: 2020-07-22 | Disposition: A | Discharge status: Home / Self Care | Payer: 59 | Attending: Urology | Admitting: Urology

## 2020-07-22 ENCOUNTER — Other Ambulatory Visit: Payer: Self-pay

## 2020-07-22 ENCOUNTER — Ambulatory Visit: Payer: 59

## 2020-07-22 ENCOUNTER — Encounter
Admission: RE | Disposition: A | Discharge status: Home / Self Care | Payer: Self-pay | Source: Home / Self Care | Attending: Urology

## 2020-07-22 ENCOUNTER — Encounter: Payer: Self-pay | Admitting: Urology

## 2020-07-22 DIAGNOSIS — N200 Calculus of kidney: Secondary | ICD-10-CM | POA: Insufficient documentation

## 2020-07-22 DIAGNOSIS — Z87442 Personal history of urinary calculi: Secondary | ICD-10-CM | POA: Insufficient documentation

## 2020-07-22 DIAGNOSIS — Z882 Allergy status to sulfonamides status: Secondary | ICD-10-CM | POA: Diagnosis not present

## 2020-07-22 HISTORY — PX: CYSTOSCOPY/URETEROSCOPY/HOLMIUM LASER/STENT PLACEMENT: SHX6546

## 2020-07-22 SURGERY — CYSTOSCOPY/URETEROSCOPY/HOLMIUM LASER/STENT PLACEMENT
Anesthesia: General | Laterality: Right

## 2020-07-22 MED ORDER — DEXMEDETOMIDINE (PRECEDEX) IN NS 20 MCG/5ML (4 MCG/ML) IV SYRINGE
PREFILLED_SYRINGE | INTRAVENOUS | Status: AC
  Filled 2020-07-22: qty 5

## 2020-07-22 MED ORDER — ONDANSETRON HCL 4 MG/2ML IJ SOLN
INTRAMUSCULAR | Status: DC | PRN
  Administered 2020-07-22: 4 mg via INTRAVENOUS

## 2020-07-22 MED ORDER — DEXTROSE 5 % IV SOLN
INTRAVENOUS | Status: DC | PRN
  Administered 2020-07-22: 3 g via INTRAVENOUS

## 2020-07-22 MED ORDER — SUGAMMADEX SODIUM 500 MG/5ML IV SOLN
INTRAVENOUS | Status: AC
  Filled 2020-07-22: qty 5

## 2020-07-22 MED ORDER — FENTANYL CITRATE (PF) 100 MCG/2ML IJ SOLN
25.0000 ug | INTRAMUSCULAR | Status: AC | PRN
  Administered 2020-07-22 (×6): 25 ug via INTRAVENOUS

## 2020-07-22 MED ORDER — LACTATED RINGERS IV SOLN: INTRAVENOUS | Status: DC

## 2020-07-22 MED ORDER — PROPOFOL 10 MG/ML IV BOLUS
INTRAVENOUS | Status: AC
  Filled 2020-07-22: qty 20

## 2020-07-22 MED ORDER — LIDOCAINE HCL (CARDIAC) PF 100 MG/5ML IV SOSY
PREFILLED_SYRINGE | INTRAVENOUS | Status: DC | PRN
  Administered 2020-07-22: 100 mg via INTRAVENOUS

## 2020-07-22 MED ORDER — DEXAMETHASONE SODIUM PHOSPHATE 10 MG/ML IJ SOLN: INTRAMUSCULAR | Status: DC | PRN

## 2020-07-22 MED ORDER — FENTANYL CITRATE (PF) 100 MCG/2ML IJ SOLN
INTRAMUSCULAR | Status: AC
  Administered 2020-07-22: 25 ug via INTRAVENOUS
  Filled 2020-07-22: qty 2

## 2020-07-22 MED ORDER — DEXAMETHASONE SODIUM PHOSPHATE 10 MG/ML IJ SOLN
INTRAMUSCULAR | Status: AC
  Filled 2020-07-22: qty 1

## 2020-07-22 MED ORDER — IOHEXOL 180 MG/ML  SOLN
INTRAMUSCULAR | Status: DC | PRN
  Administered 2020-07-22: 20 mL

## 2020-07-22 MED ORDER — ORAL CARE MOUTH RINSE: 15.0000 mL | Freq: Once | OROMUCOSAL | Status: AC

## 2020-07-22 MED ORDER — ROCURONIUM BROMIDE 100 MG/10ML IV SOLN
INTRAVENOUS | Status: DC | PRN
  Administered 2020-07-22: 10 mg via INTRAVENOUS
  Administered 2020-07-22: 50 mg via INTRAVENOUS
  Administered 2020-07-22: 10 mg via INTRAVENOUS

## 2020-07-22 MED ORDER — ONDANSETRON HCL 4 MG/2ML IJ SOLN: 4.0000 mg | Freq: Once | INTRAMUSCULAR | Status: DC | PRN

## 2020-07-22 MED ORDER — SUGAMMADEX SODIUM 500 MG/5ML IV SOLN
INTRAVENOUS | Status: DC | PRN
  Administered 2020-07-22: 500 mg via INTRAVENOUS

## 2020-07-22 MED ORDER — DEXMEDETOMIDINE (PRECEDEX) IN NS 20 MCG/5ML (4 MCG/ML) IV SYRINGE: PREFILLED_SYRINGE | INTRAVENOUS | Status: DC | PRN

## 2020-07-22 MED ORDER — OXYCODONE-ACETAMINOPHEN 5-325 MG PO TABS
1.0000 | ORAL_TABLET | Freq: Once | ORAL | Status: AC
Start: 2020-07-22 — End: 2020-07-22

## 2020-07-22 MED ORDER — DEXMEDETOMIDINE (PRECEDEX) IN NS 20 MCG/5ML (4 MCG/ML) IV SYRINGE
PREFILLED_SYRINGE | INTRAVENOUS | Status: DC | PRN
  Administered 2020-07-22 (×2): 10 ug via INTRAVENOUS

## 2020-07-22 MED ORDER — FENTANYL CITRATE (PF) 100 MCG/2ML IJ SOLN
INTRAMUSCULAR | Status: DC | PRN
  Administered 2020-07-22 (×2): 50 ug via INTRAVENOUS

## 2020-07-22 MED ORDER — PROPOFOL 10 MG/ML IV BOLUS
INTRAVENOUS | Status: DC | PRN
  Administered 2020-07-22: 300 mg via INTRAVENOUS

## 2020-07-22 MED ORDER — CEFAZOLIN IN SODIUM CHLORIDE 3-0.9 GM/100ML-% IV SOLN
3.0000 g | INTRAVENOUS | Status: DC
Start: 2020-07-22 — End: 2020-07-22
  Filled 2020-07-22: qty 100

## 2020-07-22 MED ORDER — KETOROLAC TROMETHAMINE 30 MG/ML IJ SOLN
INTRAMUSCULAR | Status: AC
  Filled 2020-07-22: qty 1

## 2020-07-22 MED ORDER — OXYCODONE-ACETAMINOPHEN 5-325 MG PO TABS: 1.0000 | ORAL_TABLET | ORAL | 0 refills | Status: DC | PRN

## 2020-07-22 MED ORDER — MIDAZOLAM HCL 2 MG/2ML IJ SOLN
INTRAMUSCULAR | Status: AC
  Filled 2020-07-22: qty 2

## 2020-07-22 MED ORDER — CHLORHEXIDINE GLUCONATE 0.12 % MT SOLN: 15.0000 mL | Freq: Once | OROMUCOSAL | Status: AC

## 2020-07-22 MED ORDER — OXYCODONE-ACETAMINOPHEN 5-325 MG PO TABS
ORAL_TABLET | ORAL | Status: AC
  Administered 2020-07-22: 1 via ORAL
  Filled 2020-07-22: qty 1

## 2020-07-22 MED ORDER — FAMOTIDINE 20 MG PO TABS
ORAL_TABLET | ORAL | Status: AC
  Administered 2020-07-22: 20 mg via ORAL
  Filled 2020-07-22: qty 1

## 2020-07-22 MED ORDER — ROCURONIUM BROMIDE 10 MG/ML (PF) SYRINGE
PREFILLED_SYRINGE | INTRAVENOUS | Status: AC
  Filled 2020-07-22: qty 10

## 2020-07-22 MED ORDER — KETOROLAC TROMETHAMINE 30 MG/ML IJ SOLN
INTRAMUSCULAR | Status: DC | PRN
  Administered 2020-07-22: 30 mg via INTRAVENOUS

## 2020-07-22 MED ORDER — CHLORHEXIDINE GLUCONATE 0.12 % MT SOLN
OROMUCOSAL | Status: AC
  Administered 2020-07-22: 15 mL via OROMUCOSAL
  Filled 2020-07-22: qty 15

## 2020-07-22 MED ORDER — ONDANSETRON HCL 4 MG/2ML IJ SOLN
INTRAMUSCULAR | Status: AC
  Filled 2020-07-22: qty 2

## 2020-07-22 MED ORDER — MIDAZOLAM HCL 2 MG/2ML IJ SOLN
INTRAMUSCULAR | Status: DC | PRN
  Administered 2020-07-22: 2 mg via INTRAVENOUS

## 2020-07-22 MED ORDER — SUCCINYLCHOLINE CHLORIDE 20 MG/ML IJ SOLN
INTRAMUSCULAR | Status: DC | PRN
  Administered 2020-07-22: 140 mg via INTRAVENOUS

## 2020-07-22 MED ORDER — FAMOTIDINE 20 MG PO TABS: 20.0000 mg | ORAL_TABLET | Freq: Once | ORAL | Status: AC

## 2020-07-22 MED ORDER — FENTANYL CITRATE (PF) 100 MCG/2ML IJ SOLN
INTRAMUSCULAR | Status: AC
  Filled 2020-07-22: qty 2

## 2020-07-22 MED ORDER — LIDOCAINE HCL (PF) 2 % IJ SOLN
INTRAMUSCULAR | Status: AC
  Filled 2020-07-22: qty 5

## 2020-07-22 SURGICAL SUPPLY — 32 items
ADH LQ OCL WTPRF AMP STRL LF (MISCELLANEOUS) ×1
ADHESIVE MASTISOL STRL (MISCELLANEOUS) ×2 IMPLANT
BAG DRAIN CYSTO-URO LG1000N (MISCELLANEOUS) ×2 IMPLANT
BASKET ZERO TIP 1.9FR (BASKET) ×2 IMPLANT
BRUSH SCRUB EZ 1% IODOPHOR (MISCELLANEOUS) ×2 IMPLANT
BSKT STON RTRVL ZERO TP 1.9FR (BASKET) ×1
CATH URET FLEX-TIP 2 LUMEN 10F (CATHETERS) ×2 IMPLANT
CATH URETL 5X70 OPEN END (CATHETERS) ×2 IMPLANT
CNTNR SPEC 2.5X3XGRAD LEK (MISCELLANEOUS)
CONT SPEC 4OZ STER OR WHT (MISCELLANEOUS)
CONT SPEC 4OZ STRL OR WHT (MISCELLANEOUS)
CONTAINER SPEC 2.5X3XGRAD LEK (MISCELLANEOUS) IMPLANT
DRAPE UTILITY 15X26 TOWEL STRL (DRAPES) ×2 IMPLANT
DRSG TEGADERM 2-3/8X2-3/4 SM (GAUZE/BANDAGES/DRESSINGS) ×2 IMPLANT
GLOVE SURG ENC MOIS LTX SZ6.5 (GLOVE) ×4 IMPLANT
GOWN STRL REUS W/ TWL LRG LVL3 (GOWN DISPOSABLE) ×2 IMPLANT
GOWN STRL REUS W/TWL LRG LVL3 (GOWN DISPOSABLE) ×4
GUIDEWIRE GREEN .038 145CM (MISCELLANEOUS) ×2 IMPLANT
GUIDEWIRE STR DUAL SENSOR (WIRE) IMPLANT
INFUSOR MANOMETER BAG 3000ML (MISCELLANEOUS) ×2 IMPLANT
IV NS IRRIG 3000ML ARTHROMATIC (IV SOLUTION) ×2 IMPLANT
KIT TURNOVER CYSTO (KITS) ×2 IMPLANT
MANIFOLD NEPTUNE II (INSTRUMENTS) IMPLANT
PACK CYSTO AR (MISCELLANEOUS) ×2 IMPLANT
SET CYSTO W/LG BORE CLAMP LF (SET/KITS/TRAYS/PACK) ×2 IMPLANT
SHEATH URETERAL 12FR 45CM (SHEATH) ×2 IMPLANT
SHEATH URETERAL 12FRX35CM (MISCELLANEOUS) IMPLANT
STENT URET 6FRX24 CONTOUR (STENTS) IMPLANT
STENT URET 6FRX26 CONTOUR (STENTS) ×2 IMPLANT
SURGILUBE 2OZ TUBE FLIPTOP (MISCELLANEOUS) ×2 IMPLANT
TRACTIP FLEXIVA PULSE ID 200 (Laser) ×2 IMPLANT
WATER STERILE IRR 1000ML POUR (IV SOLUTION) ×2 IMPLANT

## 2020-07-22 NOTE — Transfer of Care (Signed)
Immediate Anesthesia Transfer of Care Note  Patient: Nathan Flynn  Procedure(s) Performed: CYSTOSCOPY/URETEROSCOPY/HOLMIUM LASER/STENT PLACEMENT (Right )  Patient Location: PACU  Anesthesia Type:General  Level of Consciousness: awake, drowsy and patient cooperative  Airway & Oxygen Therapy: Patient Spontanous Breathing and Patient connected to face mask oxygen  Post-op Assessment: Report given to RN and Post -op Vital signs reviewed and stable  Post vital signs: Reviewed and stable  Last Vitals:  Vitals Value Taken Time  BP 132/63 07/22/20 1640  Temp 36.6 C 07/22/20 1640  Pulse 72 07/22/20 1646  Resp 26 07/22/20 1646  SpO2 100 % 07/22/20 1646  Vitals shown include unvalidated device data.  Last Pain:  Vitals:   07/22/20 1344  TempSrc: Temporal  PainSc:          Complications: No complications documented.

## 2020-07-22 NOTE — H&P (Signed)
H&P updated today on 07/22/20 [facility] CT[facility]  Returns today for staged ureteroscopy, s/p stent last Monday  No new issues  Nathan Flynn [birth year] [id number]  Referring provider: Smitty Cords, DO [address] [place],  [place] [address]     Chief Complaint  Patient presents with  . Nephrolithiasis    HPI: 35 year old male who presents today for further evaluation of right kidney stone.  He is a known remote history of kidney stones requiring ureteroscopic intervention in the past.  He did not tolerate this procedure particularly well.  About a month ago, he began developing intermittent episodes of right flank pain.  This eventually radiate down to his right lower quadrant as to his right penis.  He also had some frequency and hematuria.  All of this is resolved however he keeps having intermittent episodes of right flank pain.  He was seen and evaluated by his PCP and underwent KUB.  This indicated a 1.1 cm x 0.8 cm right UPJ stone.  At this point, is only having mild intermittent episodes of right flank pain which are not severe.  These are easily managed with finasteride.  It is no where close to the severity that he had with his previous stone episode.  He denies any fevers or chills.  He does mention that he was diagnosed with a presumed UTI by his PCP.  Urine culture from 06/19/2018 did in fact grow 100,000 colonies of E. coli.  No fevers or chills.   PMH:     Past Medical History:  Diagnosis Date  . Kidney stone     Surgical History:      Past Surgical History:  Procedure Laterality Date  . CYSTOSCOPY W/ URETERAL STENT PLACEMENT Left 06/01/2016   Procedure: CYSTOSCOPY WITH STENT  REPLACMENT;  Surgeon: Vanna Scotland, MD;  Location: [facility];  Service: Urology;  Laterality: Left;  . CYSTOSCOPY WITH STENT PLACEMENT Left 05/25/2016   Procedure: CYSTOSCOPY WITH STENT PLACEMENT;  Surgeon: Vanna Scotland, MD;  Location: [facility];  Service:  Urology;  Laterality: Left;  . none    . URETEROSCOPY Left 05/25/2016   Procedure: URETEROSCOPY;  Surgeon: Vanna Scotland, MD;  Location: [facility];  Service: Urology;  Laterality: Left;  . URETEROSCOPY WITH HOLMIUM LASER LITHOTRIPSY Left 06/01/2016   Procedure: URETEROSCOPY WITH HOLMIUM LASER LITHOTRIPSY;  Surgeon: Vanna Scotland, MD;  Location: [facility];  Service: Urology;  Laterality: Left;    Home Medications:       Allergies as of 06/21/2020      Reactions   Septra [sulfamethoxazole-trimethoprim]    "twin was allergic so told me not to take"         Medication List       Accurate as of June 21, 2020  2:50 PM. If you have any questions, ask your nurse or doctor.        cephALEXin 500 MG capsule Commonly known as: KEFLEX Take 1 capsule (500 mg total) by mouth 3 (three) times daily. For 7 days   cyclobenzaprine 10 MG tablet Commonly known as: FLEXERIL Take 1 tablet (10 mg total) by mouth 3 (three) times daily as needed for muscle spasms.   ibuprofen 200 MG tablet Commonly known as: ADVIL Take 200 mg by mouth as needed.   tamsulosin 0.4 MG Caps capsule Commonly known as: FLOMAX Take 1 capsule (0.4 mg total) by mouth daily. For 2 weeks, may repeat as needed.       Allergies:  Allergies  Allergen  Reactions  . Septra [Sulfamethoxazole-Trimethoprim]     "twin was allergic so told me not to take"    Family History:      Family History  Problem Relation Age of Onset  . Heart attack Father   . Prostate cancer Neg Hx   . Kidney cancer Neg Hx   . Bladder Cancer Neg Hx     Social History:  reports that he has never smoked. He has never used smokeless tobacco. He reports that he does not drink alcohol and does not use drugs.   Physical Exam: BP (!) 144/111   Pulse (!) 120   Ht 6' (1.829 m)   Wt (!) 500 lb (226.8 kg)   BMI 67.81 kg/m   Constitutional:  Alert and oriented, No acute distress. HEENT: [place] AT, moist mucus membranes.   Trachea midline, no masses.  Good neck movement Cardiovascular: No clubbing, cyanosis, or edema. Respiratory: Normal respiratory effort, no increased work of breathing. GI: Abdomen is soft, morbidly Skin: No rashes, bruises or suspicious lesions. Neurologic: Grossly intact, no focal deficits, moving all 4 extremities. Psychiatric: Normal mood and affect.  Laboratory Data: Recent Labs       Lab Results  Component Value Date   WBC 5.0 06/18/2020   HGB 14.2 06/18/2020   HCT 43.4 06/18/2020   MCV 89.7 06/18/2020   PLT 237 06/18/2020      Recent Labs       Lab Results  Component Value Date   CREATININE 0.86 06/18/2020     Urine culture 06/18/2020 personally reviewed  Pertinent Imaging:  DG Abd 1 View  Narrative CLINICAL DATA:  Flank pain  EXAM: ABDOMEN - 1 VIEW  COMPARISON:  CT abdomen and pelvis Jul 29, 2016  FINDINGS: There is a calcification on the right lateral to the L2-3 level measuring 1.1 x 0.8 cm, located in the expected region of the right renal pelvis. No other abnormal calcifications evident. There is moderate stool in the colon. There is no bowel dilatation or air-fluid level to suggest bowel obstruction. No free air.  IMPRESSION: Suspected calculus in the region of the right renal pelvis, possibly at the right renal pelvis-right ureteropelvic junction level, measuring 1.1 x 0.8 cm. No other abnormal calcification. Moderate stool in colon. No evident bowel obstruction or free air.   Electronically Signed By: Bretta Bang III M.D. On: 06/18/2020 11:39   Assessment & Plan:    1.  Right nephrolithiasis KUB personally reviewed  Based on the size of the stone, unfortunately is unlikely passed on its own spontaneously.  Unclear whether or not this is in the UPJ or the proximal ureter although given the intermittency and lack of severity, is likely within the renal pelvis causing a ball-valve effect.  We discussed  various treatment options for urolithiasis including observation with or without medical expulsive therapy, shockwave lithotripsy (SWL), ureteroscopy and laser lithotripsy with stent placement, and percutaneous nephrolithotomy.  We discussed that management is based on stone size, location, density, patient co-morbidities, and patient preference.   Stones <38mm in size have a >80% spontaneous passage rate. Data surrounding the use of tamsulosin for medical expulsive therapy is controversial, but meta analyses suggests it is most efficacious for distal stones between 5-4mm in size. Possible side effects include dizziness/lightheadedness, and retrograde ejaculation.  SWL has a lower stone free rate in a single procedure, but also a lower complication rate compared to ureteroscopy and avoids a stent and associated stent related symptoms. Possible complications include renal  hematoma, steinstrasse, and need for additional treatment.We discussed the role of his increased skin to stone distance can lead to decreased efficacy with shockwave lithotripsy.  Ureteroscopy with laser lithotripsy and stent placement has a higher stone free rate than SWL in a single procedure, however increased complication rate including possible infection, ureteral injury, bleeding, and stent related morbidity. Common stent related symptoms include dysuria, urgency/frequency, and flank pain.  He reports today that he did relatively poorly in the past with ureteroscopy and did not tolerate this kind.  He like to avoid this if all possible.  We discussed based on his habitus, its unclear whether or not ESWL would be effective.  Stone to skin distance as well as stone density would help potentially predict this.  He is agreeable for a stat CT scan to help further define his anatomy and make his decision on which of the above is most appropriate.  He understands that even if he chooses ESWL, the stone free rate is lower.  I will  call him on Monday with his CT scan results and let us know which surgery he would like to pursue.  In the interim, we discussed warning symptoms and indications for more urgent/emergent evaluation in the emergency room.  He is agreeable this plan.  Complete antibiotics for UTI.  - Urinalysis, Complete   Vanna Scotland, MD  [facility] [address] [place], [place] [address] [phone]

## 2020-07-22 NOTE — Anesthesia Preprocedure Evaluation (Signed)
Anesthesia Evaluation  Patient identified by MRN, date of birth, ID band Patient awake    Reviewed: Allergy & Precautions, NPO status , Patient's Chart, lab work & pertinent test results  History of Anesthesia Complications Negative for: history of anesthetic complications  Airway Mallampati: II  TM Distance: >3 FB Neck ROM: Full    Dental no notable dental hx. (+) Teeth Intact   Pulmonary neg pulmonary ROS, neg sleep apnea, neg COPD, Patient abstained from smoking.Not current smoker,    Pulmonary exam normal breath sounds clear to auscultation       Cardiovascular Exercise Tolerance: Good METS(-) hypertension(-) CAD and (-) Past MI negative cardio ROS  (-) dysrhythmias  Rhythm:Regular Rate:Normal - Systolic murmurs    Neuro/Psych negative neurological ROS  negative psych ROS   GI/Hepatic negative GI ROS, neg GERD  ,(+)     (-) substance abuse  ,   Endo/Other  neg diabetesMorbid obesity  Renal/GU      Musculoskeletal negative musculoskeletal ROS (+)   Abdominal (+) + obese,   Peds negative pediatric ROS (+)  Hematology   Anesthesia Other Findings Past Medical History: No date: History of kidney stones No date: Kidney stone  Reproductive/Obstetrics                             Anesthesia Physical  Anesthesia Plan  ASA: III  Anesthesia Plan: General   Post-op Pain Management:    Induction: Intravenous and Cricoid pressure planned  PONV Risk Score and Plan: 3 and Ondansetron, Dexamethasone, Midazolam and Treatment may vary due to age or medical condition  Airway Management Planned: Oral ETT and Video Laryngoscope Planned  Additional Equipment: None  Intra-op Plan:   Post-operative Plan: Extubation in OR  Informed Consent: I have reviewed the patients History and Physical, chart, labs and discussed the procedure including the risks, benefits and alternatives for the  proposed anesthesia with the patient or authorized representative who has indicated his/her understanding and acceptance.     Dental advisory given  Plan Discussed with: CRNA and Surgeon  Anesthesia Plan Comments: (Discussed risks of anesthesia with patient, including PONV, sore throat, lip/dental damage. Rare risks discussed as well, such as cardiorespiratory and neurological sequelae. Patient understands. Patient informed about increased incidence of above perioperative risk due to high BMI. Patient understands.  Prior uneventful intubation.)        Anesthesia Quick Evaluation

## 2020-07-22 NOTE — Discharge Instructions (Signed)
You have a ureteral stent in place.  This is a tube that extends from your kidney to your bladder.  This may cause urinary bleeding, burning with urination, and urinary frequency.  Please call our office or present to the ED if you develop fevers >101 or pain which is not able to be controlled with oral pain medications.  You may be given either Flomax and/ or ditropan to help with bladder spasms and stent pain in addition to pain medications.    Your stent is on a string.  It is taped to the head of your penis.  On Friday morning, you may untape the string and pulled gently until the entire stent is removed.  If you have any issues.  Like one of our nurses to remove it for you, please call our office.  If it is accidentally dislodged, please let us know during office hours.  Try your best not to do this.  [facility] [address], [address] [place], [place] [address] [phone]   AMBULATORY SURGERY  DISCHARGE INSTRUCTIONS   1) The drugs that you were given will stay in your system until tomorrow so for the next 24 hours you should not:  A) Drive an automobile B) Make any legal decisions C) Drink any alcoholic beverage   2) You may resume regular meals tomorrow.  Today it is better to start with liquids and gradually work up to solid foods.  You may eat anything you prefer, but it is better to start with liquids, then soup and crackers, and gradually work up to solid foods.   3) Please notify your doctor immediately if you have any unusual bleeding, trouble breathing, redness and pain at the surgery site, drainage, fever, or pain not relieved by medication.    4) Additional Instructions:        Please contact your physician with any problems or Same Day Surgery at [phone], Monday through Friday 6 am to 4 pm, or [place] at [facility] number at [phone].

## 2020-07-22 NOTE — Anesthesia Procedure Notes (Signed)
Procedure Name: Intubation Date/Time: 07/22/2020 3:14 PM Performed by: Reece Agar, CRNA Pre-anesthesia Checklist: Patient identified, Emergency Drugs available, Suction available and Patient being monitored Patient Re-evaluated:Patient Re-evaluated prior to induction Oxygen Delivery Method: Circle system utilized Preoxygenation: Pre-oxygenation with 100% oxygen Induction Type: IV induction Ventilation: Mask ventilation without difficulty and Oral airway inserted - appropriate to patient size Laryngoscope Size: McGraph and 4 Grade View: Grade II Tube type: Oral Tube size: 7.5 mm Number of attempts: 1 Airway Equipment and Method: Stylet and Oral airway Placement Confirmation: ETT inserted through vocal cords under direct vision,  positive ETCO2 and breath sounds checked- equal and bilateral Secured at: 23 cm Tube secured with: Tape Dental Injury: Teeth and Oropharynx as per pre-operative assessment

## 2020-07-22 NOTE — Anesthesia Postprocedure Evaluation (Signed)
Anesthesia Post Note  Patient: Nathan Flynn  Procedure(s) Performed: CYSTOSCOPY/URETEROSCOPY/HOLMIUM LASER/STENT PLACEMENT (Right )  Patient location during evaluation: PACU Anesthesia Type: General Level of consciousness: awake and alert Pain management: pain level controlled Vital Signs Assessment: post-procedure vital signs reviewed and stable Respiratory status: spontaneous breathing and respiratory function stable Cardiovascular status: stable Anesthetic complications: no   No complications documented.   Last Vitals:  Vitals:   07/22/20 1745 07/22/20 1749  BP: 117/68 117/68  Pulse: 65 61  Resp: 12 13  Temp: 36.7 C 36.8 C  SpO2: 91% 93%    Last Pain:  Vitals:   07/22/20 1749  TempSrc: Temporal  PainSc: 3                  Alexa Golebiewski K

## 2020-07-22 NOTE — Op Note (Signed)
Date of procedure: 07/22/20  Preoperative diagnosis:  1. Right renal calculus  Postoperative diagnosis:  1. Right renal calculus  Procedure: 1. Right ureteroscopy 2. Laser lithotripsy 3. Right retrograde pyelogram 4. Basket extraction of stone fragment 5. Right ureteral stent exchange 6. Interpretation of fluoroscopy less than 30 minutes  Surgeon: Vanna Scotland, MD  Anesthesia: General  Complications: None  Intraoperative findings: Previous renal pelvic stone now with a lower pole calyx.  Repositioned into an upper pole infundibulum for treatment.  All fragments removed via basket.  No contrast extravasation.  Stent replaced on tether.  EBL: Minimal  Specimens: Stone fragment  Drains: 6 x 26 French double-J ureteral stent on right with tether  Indication: Nathan Flynn is a 35 y.o. patient with 1 cm renal pelvic stone who underwent an attempted ureteroscopy last week however the scope was unable to be advanced to the proximal ureter this stent was placed and he returns today for staged procedure.  After reviewing the management options for treatment, he elected to proceed with the above surgical procedure(s). We have discussed the potential benefits and risks of the procedure, side effects of the proposed treatment, the likelihood of the patient achieving the goals of the procedure, and any potential problems that might occur during the procedure or recuperation. Informed consent has been obtained.  Description of procedure:  The patient was taken to the operating room and general anesthesia was induced.  The patient was placed in the dorsal lithotomy position, prepped and draped in the usual sterile fashion, and preoperative antibiotics were administered. A preoperative time-out was performed.   A 21 French cystoscope was advanced per urethra into the bladder.  Attention was turned to the right ureteral orifice which was seen emanating from the right UO.  There is mild  amount of edema around this orifice.  The distal coil the stent was grasped and brought to level the urethral meatus.  The stent was then cannulated using a sensor wire up to the level of the kidney leaving the wire in place removing the stent.  A dual-lumen access sheath was used to introduce a second Super Stiff wire up to the level of the kidney.  A sensor wire was snapped in place as a safety wire.  A Cook 12/14 French ureteral access sheath was advanced to level the proximal ureter under fluoroscopy.  The inner lumen was removed.  A digital flexible Wolf ureteroscope, 8 [place] was advanced to the renal pelvis.  On scout imaging, the stone that was previously located in the renal pelvis is now in a lower pole calyx.  Unfortunately, this angle was a little bit difficult.  As such, used a 1.9 tipless nitinol basket to reposition the stone into an upper pole infundibulum where it was wedged.  I then brought in a 242 m laser fiber and using dusting settings of 0.3 J and 60 Hz, the stone was dusted into tiny fragments.  The majority of these fragments ended up in various calyces of the upper pole complex.  The 1.9 tipless nitinol [place] basket was then used to remove all remaining fragments.  I did find a few fragments in the midpole calyx which were also obliterated with the laser or basketed.  There were no stone fragments in the lower pole.  A final retrograde pyelogram created roadmap to confirm that each every calyx was visualized and there is no residual stone burden.  Is then slowly backed the scope down the length of the ureter inspecting  along the way.  There are some mild edema in the proximal ureter otherwise it was unremarkable.  There were no additional stone fragments appreciated.  The wire was then backloaded over rigid cystoscope.  A 6 x 26 French double-J ureteral stent was then placed with a full coil in the upper pole calyx as well as a full coil within the bladder.  The bladder was then  drained.  The string was left attached to the distal coil of the stent which was affixed to the patient's glans using Mastisol and Tegaderm.  He was then cleaned and dried, repositioned in supine position, reversed anesthesia and taken to the PACU in stable condition.  Plan: We will have him remove his own stent on Friday.  He will follow-up with me in a month with renal ultrasound prior.  Vanna Scotland, M.D.

## 2020-07-23 ENCOUNTER — Other Ambulatory Visit: Payer: Self-pay | Admitting: Urology

## 2020-07-23 ENCOUNTER — Encounter: Payer: Self-pay | Admitting: Urology

## 2020-07-23 ENCOUNTER — Other Ambulatory Visit: Payer: Self-pay

## 2020-07-23 DIAGNOSIS — N200 Calculus of kidney: Secondary | ICD-10-CM

## 2020-07-23 MED ORDER — ONDANSETRON 4 MG PO TBDP: 4.0000 mg | ORAL_TABLET | Freq: Three times a day (TID) | ORAL | 0 refills | Status: DC | PRN

## 2020-07-25 LAB — CALCULI, WITH PHOTOGRAPH (CLINICAL LAB)
Calcium Oxalate Monohydrate: 100 %
Weight Calculi: 16 mg

## 2020-07-26 ENCOUNTER — Encounter: Payer: Self-pay | Admitting: Urology

## 2020-07-26 NOTE — Telephone Encounter (Signed)
Called patient and spoke with him, explaining that he is most likely experiencing ureteral/bladder spasms from stent removal. He was encouraged to utilized to oxybutinin and tamsulosin as well as OTC NSAIDS to help with discomfort. Patient states he is taking oxybutinin and tamsulosin and will try ibuprofen. He was offered an in office Toradol injection, he declined at this time. He states he is feeling somewhat better and pain does come in waves. If his pain worsens he will call back and inquire about an injection appt. He was given office hours as a reference.

## 2020-07-30 ENCOUNTER — Other Ambulatory Visit: Payer: Self-pay

## 2020-07-30 ENCOUNTER — Ambulatory Visit (INDEPENDENT_AMBULATORY_CARE_PROVIDER_SITE_OTHER): Payer: 59 | Admitting: Family Medicine

## 2020-07-30 ENCOUNTER — Encounter: Payer: Self-pay | Admitting: Family Medicine

## 2020-07-30 VITALS — BP 161/78 | HR 74 | Ht 72.0 in | Wt >= 6400 oz

## 2020-07-30 DIAGNOSIS — I10 Essential (primary) hypertension: Secondary | ICD-10-CM

## 2020-07-30 DIAGNOSIS — J069 Acute upper respiratory infection, unspecified: Secondary | ICD-10-CM

## 2020-07-30 DIAGNOSIS — Z87442 Personal history of urinary calculi: Secondary | ICD-10-CM

## 2020-07-30 DIAGNOSIS — Z6841 Body Mass Index (BMI) 40.0 and over, adult: Secondary | ICD-10-CM

## 2020-07-30 MED ORDER — AMLODIPINE BESYLATE 10 MG PO TABS: 10.0000 mg | ORAL_TABLET | Freq: Every day | ORAL | 1 refills | Status: DC

## 2020-07-30 NOTE — Patient Instructions (Addendum)
Thank you for coming to the office today.  Elevated BP. Please keep track of BP for 1 week first, then if BP still >140/90, you can go ahead and proceed with the Amlodipine medication.  May start with half pill for 5mg  daily initially but then eventually will need 10mg  daily.  Labs fasting next visit with me, no food or drink after midnight, have plenty of water.  Please schedule a Follow-up Appointment to: Return in about 2 months (around 09/29/2020) for 2 month Annual Physical + HTN (AM apt fasting lab after).  If you have any other questions or concerns, please feel free to call the office or send a message through MyChart. You may also schedule an earlier appointment if necessary.  Additionally, you may be receiving a survey about your experience at our office within a few days to 1 week by e-mail or mail. We value your feedback.  , DO [facility], Saralyn Pilar

## 2020-07-30 NOTE — Progress Notes (Signed)
Subjective:    Patient ID: Nathan Flynn, male    DOB: [birth year], 35 y.o.   MRN: [id number]  Nathan Flynn is a 34 y.o. male presenting on 07/30/2020 for Hospitalization Follow-up (Surgery to remove kidney stones )   HPI   Nephrolithiasis S/p Cystoscopy/Ureteroscopy stone removal, x 2 surgeries, initial 5/2 unsuccessful and repeat 5/9 [facility] Dr Apolinar Junes Has improved since that time.  Urinary frequency still present. Still sore, worse after bending over Finish pain medication, nausea Stopped Flomax due to no more urinary problem Resolved bladder spasms, off oxybutynin Nausea improving. Next has upcoming f/u with Dr Apolinar Junes and Renal US in June 2022  Chest congestion recently - resolving now. - Home tests on COVID19 negative, all family. - overall improved. Not improved OTC  CHRONIC HTN: Reports history of elevated BP, never really established for BP in past, never treated, he has been resuming care here now lately Current Meds - None   Denies CP, dyspnea, HA, edema, dizziness / lightheadedness   Depression screen [facility] 2/9 07/30/2020  Decreased Interest 1  Down, Depressed, Hopeless 0  PHQ - 2 Score 1  Altered sleeping 0  Tired, decreased energy 3  Change in appetite 2  Feeling bad or failure about yourself  0  Trouble concentrating 1  Moving slowly or fidgety/restless 0  Suicidal thoughts 0  PHQ-9 Score 7  Difficult doing work/chores Somewhat difficult    Social History   Tobacco Use  . Smoking status: Never Smoker  . Smokeless tobacco: Never Used  Vaping Use  . Vaping Use: Never used  Substance Use Topics  . Alcohol use: No  . Drug use: No    Review of Systems Per HPI unless specifically indicated above     Objective:    BP (!) 161/78   Pulse 74   Ht 6' (1.829 m)   Wt (!) 495 lb 8 oz (224.8 kg)   SpO2 96%   BMI 67.20 kg/m   Wt Readings from Last 3 Encounters:  07/30/20 (!) 495 lb 8 oz (224.8 kg)  07/22/20 (!) 500 lb (226.8  kg)  07/15/20 (!) 504 lb 13.7 oz (229 kg)    Physical Exam Vitals and nursing note reviewed.  Constitutional:      General: He is not in acute distress.    Appearance: He is well-developed. He is obese. He is not diaphoretic.     Comments: Well-appearing, comfortable, cooperative  HENT:     Head: Normocephalic and atraumatic.  Eyes:     General:        Right eye: No discharge.        Left eye: No discharge.     Conjunctiva/sclera: Conjunctivae normal.  Cardiovascular:     Rate and Rhythm: Normal rate.  Pulmonary:     Effort: Pulmonary effort is normal.  Skin:    General: Skin is warm and dry.     Findings: No erythema or rash.  Neurological:     Mental Status: He is alert and oriented to person, place, and time.  Psychiatric:        Behavior: Behavior normal.     Comments: Well groomed, good eye contact, normal speech and thoughts       Results for orders placed or performed during the hospital encounter of 07/22/20  Calculi, with Photograph (to Clinical Lab)  Result Value Ref Range   Source Calculi Comment    Color Calculi Brown    Size Calculi 3x1 mm  Weight Calculi 16 mg   Composition Calculi Comment    Calcium Oxalate Monohydrate 100 %   Photo Calculi Comment    Comment Calculi 3 Comment    Please Note: Comment    DISCLAIMER: Comment       Assessment & Plan:   Problem List Items Addressed This Visit    Morbid obesity with BMI of 60.0-69.9, adult (HCC)   Essential hypertension - Primary    Still elevated initial BP and hospital BP readings and recent visit results compared in past. New dx HTN. - Home BP readings none available. Has cuff  No known complications    Plan:  1. START new med Amlodipine 10mg  daily, can start with half tab for 5mg  or inc to 10mg  if needed, caution potential side effect benefits 2. Encourage improved lifestyle - low sodium diet, regular exercise 3. Start monitor BP outside office, bring readings to next visit, if persistently  >140/90 or new symptoms notify office sooner 4. Follow-up 2 months for physical       Relevant Medications   amLODipine (NORVASC) 10 MG tablet    Other Visit Diagnoses    History of nephrolithiasis       Viral URI with cough          Nephrolithiasis S/p cystoscopy/ureteroscopy stone removal surgery x 2, 5/2 and 5/9 by Dr Urology He is doing well now, seems still has some residual side effects from surgery but overall improved. No further kidney stone pain or symptoms. No longer on medications for these problems. Off pain med, nausea pill, flomax etc.  He has upcoming Renal in 1 month and will see Dr 7/9 Urology at that time.  #URI w cough Home COVID negative AFebrile Improving, had chest congestion, sick contact family member children with URI symptoms and wife. He is improving now, no further concerns Continue cough cold supportive care OTC no new meds  Meds ordered this encounter  Medications  . amLODipine (NORVASC) 10 MG tablet    Sig: Take 1 tablet (10 mg total) by mouth daily.    Dispense:  90 tablet    Refill:  1      Follow up plan: Return in about 2 months (around 09/29/2020) for 2 month Annual Physical + HTN (AM apt fasting lab after).  [place], DO [facility] 07/30/2020, 8:28 AM

## 2020-07-30 NOTE — Assessment & Plan Note (Signed)
Still elevated initial BP and hospital BP readings and recent visit results compared in past. New dx HTN. - Home BP readings none available. Has cuff  No known complications    Plan:  1. START new med Amlodipine 10mg  daily, can start with half tab for 5mg  or inc to 10mg  if needed, caution potential side effect benefits 2. Encourage improved lifestyle - low sodium diet, regular exercise 3. Start monitor BP outside office, bring readings to next visit, if persistently >140/90 or new symptoms notify office sooner 4. Follow-up 2 months for physical

## 2020-08-27 ENCOUNTER — Ambulatory Visit: Payer: 59 | Admitting: Urology

## 2020-09-04 ENCOUNTER — Ambulatory Visit: Payer: 59

## 2020-09-18 ENCOUNTER — Ambulatory Visit: Payer: 59 | Admitting: Urology

## 2020-09-24 ENCOUNTER — Ambulatory Visit
Admission: RE | Admit: 2020-09-24 | Discharge: 2020-09-24 | Disposition: A | Discharge status: Home / Self Care | Payer: 59 | Source: Ambulatory Visit | Attending: Urology | Admitting: Urology

## 2020-09-24 ENCOUNTER — Other Ambulatory Visit: Payer: Self-pay

## 2020-09-24 DIAGNOSIS — N200 Calculus of kidney: Secondary | ICD-10-CM | POA: Insufficient documentation

## 2020-09-25 ENCOUNTER — Ambulatory Visit (INDEPENDENT_AMBULATORY_CARE_PROVIDER_SITE_OTHER): Payer: 59 | Admitting: Urology

## 2020-09-25 ENCOUNTER — Encounter: Payer: Self-pay | Admitting: Urology

## 2020-09-25 VITALS — BP 186/98 | HR 98 | Ht 72.0 in | Wt >= 6400 oz

## 2020-09-25 DIAGNOSIS — N200 Calculus of kidney: Secondary | ICD-10-CM | POA: Diagnosis not present

## 2020-09-25 NOTE — Progress Notes (Signed)
09/25/2020 8:34 AM   Nathan Flynn Feb 24, 1986 [id number]  Referring provider: Smitty Cords, DO [address] [place],  [place] [address]  Chief Complaint  Patient presents with   Nephrolithiasis    HPI: 35 year old male who returns today following staged right ureteroscopy.  Please see previous notes.  Procedure complicated by morbid obesity requiring staged procedure.  Ultimately, successful in treating all stones.  Stent subsequently removed.  Returns today with follow-up renal ultrasound.  This shows no obvious stone debris or residual hydronephrosis.  I personally reviewed these images today.  The final radiologic interpretation is pending.  Image quality is limited likely due to patient's habitus.  Stone composition 100% calcium oxalate monohydrate.  PMH: Past Medical History:  Diagnosis Date   History of kidney stones    Kidney stone     Surgical History: Past Surgical History:  Procedure Laterality Date   CYSTOSCOPY W/ URETERAL STENT PLACEMENT Left 06/01/2016   Procedure: CYSTOSCOPY WITH STENT  REPLACMENT;  Surgeon: Vanna Scotland, MD;  Location: [facility];  Service: Urology;  Laterality: Left;   CYSTOSCOPY WITH STENT PLACEMENT Left 05/25/2016   Procedure: CYSTOSCOPY WITH STENT PLACEMENT;  Surgeon: Vanna Scotland, MD;  Location: [facility];  Service: Urology;  Laterality: Left;   CYSTOSCOPY/URETEROSCOPY/HOLMIUM LASER/STENT PLACEMENT Right 07/15/2020   Procedure: CYSTOSCOPY/URETEROSCOPY/STENT PLACEMENT;  Surgeon: Vanna Scotland, MD;  Location: [facility];  Service: Urology;  Laterality: Right;   CYSTOSCOPY/URETEROSCOPY/HOLMIUM LASER/STENT PLACEMENT Right 07/22/2020   Procedure: CYSTOSCOPY/URETEROSCOPY/HOLMIUM LASER/STENT PLACEMENT;  Surgeon: Vanna Scotland, MD;  Location: [facility];  Service: Urology;  Laterality: Right;   none     URETEROSCOPY Left 05/25/2016   Procedure: URETEROSCOPY;  Surgeon: Vanna Scotland, MD;  Location: [facility];  Service: Urology;   Laterality: Left;   URETEROSCOPY WITH HOLMIUM LASER LITHOTRIPSY Left 06/01/2016   Procedure: URETEROSCOPY WITH HOLMIUM LASER LITHOTRIPSY;  Surgeon: Vanna Scotland, MD;  Location: [facility];  Service: Urology;  Laterality: Left;    Home Medications:  Allergies as of 09/25/2020       Reactions   Septra [sulfamethoxazole-trimethoprim]    "twin was allergic so told me not to take"        Medication List        Accurate as of September 25, 2020  8:34 AM. If you have any questions, ask your nurse or doctor.          STOP taking these medications    ibuprofen 200 MG tablet Commonly known as: ADVIL Stopped by: Vanna Scotland, MD   ondansetron 4 MG disintegrating tablet Commonly known as: Zofran ODT Stopped by: Vanna Scotland, MD       TAKE these medications    amLODipine 10 MG tablet Commonly known as: NORVASC Take 1 tablet (10 mg total) by mouth daily.        Allergies:  Allergies  Allergen Reactions   Septra [Sulfamethoxazole-Trimethoprim]     "twin was allergic so told me not to take"    Family History: Family History  Problem Relation Age of Onset   Heart attack Father    Prostate cancer Neg Hx    Kidney cancer Neg Hx    Bladder Cancer Neg Hx     Social History:  reports that he has never smoked. He has never used smokeless tobacco. He reports that he does not drink alcohol and does not use drugs.   Physical Exam: BP (!) 186/98   Pulse 98   Ht 6' (1.829 m)   Wt (!) 495 lb (  224.5 kg)   BMI 67.13 kg/m   Constitutional:  Alert and oriented, No acute distress. HEENT: [place] AT, moist mucus membranes.  Trachea midline, no masses. Cardiovascular: No clubbing, cyanosis, or edema. Respiratory: Normal respiratory effort, no increased work of breathing.y. Skin: No rashes, bruises or suspicious lesions. Neurologic: Grossly intact, no focal deficits, moving all 4 extremities. Psychiatric: Normal mood and affect.  Laboratory Data:  Lab Results  Component Value  Date   CREATININE 0.86 06/18/2020   Assessment & Plan:    1. Nephrolithiasis Status post ureteroscopy without residual hydronephrosis  Reviewed stone composition  We discussed general stone prevention techniques including drinking plenty water with goal of producing 2.5 L urine daily, increased citric acid intake, avoidance of high oxalate containing foods, and decreased salt intake.  Information about dietary recommendations given today.  Follow-up in 1 year with KUB  Vanna Scotland, MD  [facility] [address], [address] [place], [place] [address] [phone]

## 2020-10-01 ENCOUNTER — Encounter: Payer: Self-pay | Admitting: Family Medicine

## 2020-10-01 ENCOUNTER — Ambulatory Visit (INDEPENDENT_AMBULATORY_CARE_PROVIDER_SITE_OTHER): Payer: 59 | Admitting: Family Medicine

## 2020-10-01 ENCOUNTER — Other Ambulatory Visit: Payer: Self-pay

## 2020-10-01 VITALS — BP 159/90 | HR 77 | Ht 72.0 in | Wt >= 6400 oz

## 2020-10-01 DIAGNOSIS — I10 Essential (primary) hypertension: Secondary | ICD-10-CM

## 2020-10-01 DIAGNOSIS — R079 Chest pain, unspecified: Secondary | ICD-10-CM | POA: Diagnosis not present

## 2020-10-01 DIAGNOSIS — Z6841 Body Mass Index (BMI) 40.0 and over, adult: Secondary | ICD-10-CM | POA: Diagnosis not present

## 2020-10-01 MED ORDER — HYDROCHLOROTHIAZIDE 25 MG PO TABS: 25.0000 mg | ORAL_TABLET | Freq: Every day | ORAL | 2 refills | Status: DC

## 2020-10-01 MED ORDER — NITROGLYCERIN 0.4 MG SL SUBL: 0.4000 mg | SUBLINGUAL_TABLET | SUBLINGUAL | 2 refills | Status: AC | PRN

## 2020-10-01 NOTE — Progress Notes (Signed)
Subjective:    Patient ID: Nathan Flynn, male    DOB: [birth year], 35 y.o.   MRN: [id number]  Nathan Flynn is a 35 y.o. male presenting on 10/01/2020 for Hypertension and Chest Pain   HPI  History of Chest Pain He does not endorse any active chest pain right now. He describes standing in kitchen at counter, experienced sudden L sided chest pain that spread across chest and into back, and his Left arm felt "tired" as if he did a work out, and he had significant pain for about 1 minute, then residual soreness in chest and left arm for the rest of 1-2 days. It then has resolved. - He has describes occasional soreness on left side of chest lasting 1-2 min, possibly related to activity bending or exertion. But not very predictable. Can occur at rest. - He works in [employer] and often can have good days still with able to fully exert himself without any symptoms. Seems unpredictable. - Unrelated to eating PO intake, heartburn or digestive symptoms - He has multiple significant stressors - with lost 3 family members in past few months, recently lost brother due to alcoholism. He has multiple family history of heart disease, Father had MI passed age 80, and paternal Aunt in 18s with MI. - Denies any dyspnea, nausea vomiting, sweating, headache  CHRONIC HTN: Reports history of elevated BP, never really established for BP in past, never treated in past, until last visit 07/30/20, he was started on Amlodipine 10mg  daily. - Home BP today 148/76, avg readings 160/80s. Current Meds - Amlodipine 10mg  daily Denies CP, dyspnea, HA, edema, dizziness / lightheadedness   Depression screen [facility] 2/9 10/01/2020 07/30/2020  Decreased Interest 1 1  Down, Depressed, Hopeless 1 0  PHQ - 2 Score 2 1  Altered sleeping 0 0  Tired, decreased energy 3 3  Change in appetite 0 2  Feeling bad or failure about yourself  0 0  Trouble concentrating 0 1  Moving slowly or fidgety/restless 0 0  Suicidal  thoughts 0 0  PHQ-9 Score 5 7  Difficult doing work/chores Not difficult at all Somewhat difficult    Social History   Tobacco Use   Smoking status: Never   Smokeless tobacco: Never  Vaping Use   Vaping Use: Never used  Substance Use Topics   Alcohol use: No   Drug use: No    Review of Systems Per HPI unless specifically indicated above     Objective:    BP (!) 159/90   Pulse 77   Ht 6' (1.829 m)   Wt (!) 508 lb (230.4 kg)   SpO2 100%   BMI 68.90 kg/m   Wt Readings from Last 3 Encounters:  10/01/20 (!) 508 lb (230.4 kg)  09/25/20 (!) 495 lb (224.5 kg)  07/30/20 (!) 495 lb 8 oz (224.8 kg)    Physical Exam Vitals and nursing note reviewed.  Constitutional:      General: He is not in acute distress.    Appearance: He is well-developed. He is obese. He is not diaphoretic.     Comments: Well-appearing, comfortable, cooperative  HENT:     Head: Normocephalic and atraumatic.  Eyes:     General:        Right eye: No discharge.        Left eye: No discharge.     Conjunctiva/sclera: Conjunctivae normal.  Neck:     Thyroid: No thyromegaly.  Cardiovascular:     Rate and  Rhythm: Normal rate and regular rhythm.     Pulses: Normal pulses.     Heart sounds: Normal heart sounds. No murmur heard. Pulmonary:     Effort: Pulmonary effort is normal. No respiratory distress.     Breath sounds: Normal breath sounds. No wheezing or rales.  Chest:     Chest wall: No tenderness.  Musculoskeletal:        General: Normal range of motion.     Cervical back: Normal range of motion and neck supple.  Lymphadenopathy:     Cervical: No cervical adenopathy.  Skin:    General: Skin is warm and dry.     Findings: No erythema or rash.  Neurological:     Mental Status: He is alert and oriented to person, place, and time. Mental status is at baseline.  Psychiatric:        Behavior: Behavior normal.     Comments: Well groomed, good eye contact, normal speech and thoughts     Results  for orders placed or performed during the hospital encounter of 07/22/20  Calculi, with Photograph (to Clinical Lab)  Result Value Ref Range   Source Calculi Comment    Color Calculi Brown    Size Calculi 3x1 mm   Weight Calculi 16 mg   Composition Calculi Comment    Calcium Oxalate Monohydrate 100 %   Photo Calculi Comment    Comment Calculi 3 Comment    Please Note: Comment    DISCLAIMER: Comment       Assessment & Plan:   Problem List Items Addressed This Visit     Morbid obesity with BMI of 60.0-69.9, adult (HCC)   Relevant Orders   Ambulatory referral to Cardiology   Essential hypertension - Primary   Relevant Medications   hydrochlorothiazide (HYDRODIURIL) 25 MG tablet   nitroGLYCERIN (NITROSTAT) 0.4 MG SL tablet   Other Relevant Orders   Ambulatory referral to Cardiology   Other Visit Diagnoses     Chest pain, unspecified type       Relevant Medications   nitroGLYCERIN (NITROSTAT) 0.4 MG SL tablet   Other Relevant Orders   Ambulatory referral to Cardiology      No active chest pain today Defer EKG, reviewed last EKG 07/2020, normal sinus this was a pre op EKG  Increase HTN management, add new HCTZ 25mg  daily, continue Amlodipine 10mg  daily  Urgent referral to [facility] for evaluation of Left sided anginal symptoms, with elevated BP hypertension, morbid obesity BMI >60, he has strong early family history of heart disease father passed MI age 6, paternal aunt MI age 17.   Add NTG PRN counseling on use  Return 4 weeks to discuss other issues such as medical weight management to help with the above concerns as well reduce his risk in future w/ wt loss help improve BP  Also can review stress/anxiety etc given significant stressors.  Return criteria to ED for emergency reviewed   Orders Placed This Encounter  Procedures   Ambulatory referral to Cardiology    Referral Priority:   Urgent    Referral Type:   Consultation    Referral Reason:   Specialty  Services Required    Requested Specialty:   Cardiology    Number of Visits Requested:   1     Meds ordered this encounter  Medications   hydrochlorothiazide (HYDRODIURIL) 25 MG tablet    Sig: Take 1 tablet (25 mg total) by mouth daily.    Dispense:  30  tablet    Refill:  2   nitroGLYCERIN (NITROSTAT) 0.4 MG SL tablet    Sig: Place 1 tablet (0.4 mg total) under the tongue every 5 (five) minutes as needed for chest pain.    Dispense:  30 tablet    Refill:  2      Follow up plan: Return in about 4 weeks (around 10/29/2020) for 4 weeks HTN / Cards, Weight management follow-up.    Saralyn Pilar, DO [facility] 10/01/2020, 8:43 AM

## 2020-10-01 NOTE — Patient Instructions (Addendum)
Thank you for coming to the office today.  Start HCTZ 25mg  daily Continue Amlodipine  Use sublingual nitro if having severe episode again  [facility]) [facility] at [facility] [address] [place], [place] Main: [phone]   Call them if not improved.  If you have any significant chest pain that does not go away within 30 minutes, is accompanied by nausea, sweating, shortness of breath, or made worse by activity, this may be evidence of a heart attack, especially if symptoms worsening instead of improving, please call 911 or go directly to the emergency room immediately for evaluation.   Please schedule a Follow-up Appointment to: Return in about 4 weeks (around 10/29/2020) for 4 weeks HTN / Cards, Weight management follow-up.  If you have any other questions or concerns, please feel free to call the office or send a message through MyChart. You may also schedule an earlier appointment if necessary.  Additionally, you may be receiving a survey about your experience at our office within a few days to 1 week by e-mail or mail. We value your feedback.  10/31/2020, DO [facility], [facility]

## 2020-10-03 ENCOUNTER — Ambulatory Visit: Payer: 59 | Admitting: Cardiology

## 2020-10-03 ENCOUNTER — Other Ambulatory Visit: Payer: Self-pay

## 2020-10-03 ENCOUNTER — Encounter: Payer: Self-pay | Admitting: Cardiology

## 2020-10-03 ENCOUNTER — Ambulatory Visit (INDEPENDENT_AMBULATORY_CARE_PROVIDER_SITE_OTHER): Payer: 59 | Admitting: Cardiology

## 2020-10-03 VITALS — BP 140/80 | HR 95 | Ht 72.0 in | Wt >= 6400 oz

## 2020-10-03 DIAGNOSIS — I10 Essential (primary) hypertension: Secondary | ICD-10-CM

## 2020-10-03 DIAGNOSIS — R072 Precordial pain: Secondary | ICD-10-CM | POA: Diagnosis not present

## 2020-10-03 DIAGNOSIS — Z6841 Body Mass Index (BMI) 40.0 and over, adult: Secondary | ICD-10-CM

## 2020-10-03 NOTE — Patient Instructions (Addendum)
Medication Instructions:   Your physician recommends that you continue on your current medications as directed. Please refer to the Current Medication list given to you today.  *If you need a refill on your cardiac medications before your next appointment, please call your pharmacy*   Lab Work: None ordered If you have labs (blood work) drawn today and your tests are completely normal, you will receive your results only by: MyChart Message (if you have MyChart) OR A paper copy in the mail If you have any lab test that is abnormal or we need to change your treatment, we will call you to review the results.   Testing/Procedures:   Your physician has requested that you have an echocardiogram. Echocardiography is a painless test that uses sound waves to create images of your heart. It provides your doctor with information about the size and shape of your heart and how well your heart's chambers and valves are working. This procedure takes approximately one hour. There are no restrictions for this procedure.   2.   [facility] MYOVIEW      Your caregiver has ordered a Stress Test with nuclear imaging. The purpose of this test is to evaluate the blood supply to your heart muscle. This procedure is referred to as a "Non-Invasive Stress Test." This is because other than having an IV started in your vein, nothing is inserted or "invades" your body. Cardiac stress tests are done to find areas of poor blood flow to the heart by determining the extent of coronary artery disease (CAD). Some patients exercise on a treadmill, which naturally increases the blood flow to your heart, while others who are  unable to walk on a treadmill due to physical limitations have a pharmacologic/chemical stress agent called Lexiscan . This medicine will mimic walking on a treadmill by temporarily increasing your coronary blood flow.      PLEASE REPORT TO [facility] MEDICAL MALL ENTRANCE   THE VOLUNTEERS AT THE FIRST DESK WILL DIRECT  YOU WHERE TO GO     *Please note: these test may take two appointments over two consecutives days.     Date of Procedure:_____________________________________   Arrival Time for Procedure:______________________________    PLEASE NOTIFY THE OFFICE AT LEAST 24 HOURS IN ADVANCE IF YOU ARE UNABLE TO KEEP YOUR APPOINTMENT.  [phone]  PLEASE NOTIFY NUCLEAR MEDICINE AT [facility] AT LEAST 24 HOURS IN ADVANCE IF YOU ARE UNABLE TO KEEP YOUR APPOINTMENT. [phone]    How to prepare for your Myoview test:        _XX___:  Hold other medications as follows: hydrochlorothiazide (HYDRODIURIL)    1. Do not eat or drink after midnight  2. No caffeine for 24 hours prior to test  3. No smoking 24 hours prior to test.  4. Unless instructed otherwise, Take your medication with a small sips of water.        Follow-Up: At [facility], you and your health needs are our priority.  As part of our continuing mission to provide you with exceptional heart care, we have created designated Provider Care Teams.  These Care Teams include your primary Cardiologist (physician) and Advanced Practice Providers (APPs -  Physician Assistants and Nurse Practitioners) who all work together to provide you with the care you need, when you need it.  We recommend signing up for the patient portal called "MyChart".  Sign up information is provided on this After Visit Summary.  MyChart is used to connect with patients for Virtual Visits (Telemedicine).  Patients are able to view lab/test results, encounter notes, upcoming appointments, etc.  Non-urgent messages can be sent to your provider as well.   To learn more about what you can do with MyChart, go to [email or web address].    Your next appointment:   Follow up after testing   The format for your next appointment:   In Person  Provider:   You may see Debbe Odea, MD or one of the following Advanced Practice Providers on your designated Care Team:    Nicolasa Ducking, NP Eula Listen, PA-C Marisue Ivan, PA-C Cadence Fransico Michael, [place]   Other Instructions

## 2020-10-03 NOTE — Progress Notes (Signed)
Cardiology Office Note:    Date:  10/03/2020   ID:  Nathan Flynn, DOB 04/19/1985, MRN [id number]  PCP:  Nathan Flynn   [facility] Providers Cardiologist:  Nathan Flynn     Referring Flynn: Nathan PilarKaramalegos, Alexander *   Chief Complaint  Patient presents with   New patient    Referred by PCP for HTN and chest pain. Meds reviewed verbally with patient.    Nathan Flynn is a 35 y.o. male who is being seen today for the evaluation of chest pain at the request of Nathan PilarKaramalegos, Alexander *.   History of Present Illness:    Nathan Flynn is a 35 y.o. male with a hx of hypertension, morbid obesity, family history of early CAD who presents due to chest pain.  Patient was at home making lunch when he felt sudden onset of left-sided chest discomfort, radiating to the right side.  Symptoms persisted throughout the day into the next.  Denies having similar episodes, occasionally gets chest soreness after working on moving objects.  He was worried due to family history of early CAD in his father who had heart attacks in his 30s.  Has lost a couple of family members this year and also working a lot causing some stress.  He denies smoking, started taking HCTZ 2 days ago for additional BP control.  He previously was on amlodipine.  Past Medical History:  Diagnosis Date   History of kidney stones    Kidney stone     Past Surgical History:  Procedure Laterality Date   CYSTOSCOPY W/ URETERAL STENT PLACEMENT Left 06/01/2016   Procedure: CYSTOSCOPY WITH STENT  REPLACMENT;  Surgeon: Nathan ScotlandAshley Brandon, Flynn;  Location: [facility] OR[facility];  Service: Urology;  Laterality: Left;   CYSTOSCOPY WITH STENT PLACEMENT Left 05/25/2016   Procedure: CYSTOSCOPY WITH STENT PLACEMENT;  Surgeon: Nathan ScotlandAshley Brandon, Flynn;  Location: [facility];  Service: Urology;  Laterality: Left;   CYSTOSCOPY/URETEROSCOPY/HOLMIUM LASER/STENT PLACEMENT Right 07/15/2020   Procedure:  CYSTOSCOPY/URETEROSCOPY/STENT PLACEMENT;  Surgeon: Nathan ScotlandBrandon, Ashley, Flynn;  Location: [facility];  Service: Urology;  Laterality: Right;   CYSTOSCOPY/URETEROSCOPY/HOLMIUM LASER/STENT PLACEMENT Right 07/22/2020   Procedure: CYSTOSCOPY/URETEROSCOPY/HOLMIUM LASER/STENT PLACEMENT;  Surgeon: Nathan ScotlandBrandon, Ashley, Flynn;  Location: [facility];  Service: Urology;  Laterality: Right;   none     URETEROSCOPY Left 05/25/2016   Procedure: URETEROSCOPY;  Surgeon: Nathan ScotlandAshley Brandon, Flynn;  Location: [facility];  Service: Urology;  Laterality: Left;   URETEROSCOPY WITH HOLMIUM LASER LITHOTRIPSY Left 06/01/2016   Procedure: URETEROSCOPY WITH HOLMIUM LASER LITHOTRIPSY;  Surgeon: Nathan ScotlandAshley Brandon, Flynn;  Location: [facility];  Service: Urology;  Laterality: Left;    Current Medications: Current Meds  Medication Sig   amLODipine (NORVASC) 10 MG tablet Take 1 tablet (10 mg total) by mouth daily.   hydrochlorothiazide (HYDRODIURIL) 25 MG tablet Take 1 tablet (25 mg total) by mouth daily.   nitroGLYCERIN (NITROSTAT) 0.4 MG SL tablet Place 1 tablet (0.4 mg total) under the tongue every 5 (five) minutes as needed for chest pain.     Allergies:   Septra [sulfamethoxazole-trimethoprim]   Social History   Socioeconomic History   Marital status: Married    Spouse name: Not on file   Number of children: Not on file   Years of education: Not on file   Highest education level: Not on file  Occupational History   Not on file  Tobacco Use   Smoking status: Never   Smokeless tobacco: Never  Vaping Use   Vaping Use: Never  used  Substance and Sexual Activity   Alcohol use: No   Drug use: No   Sexual activity: Not on file  Other Topics Concern   Not on file  Social History Narrative   Not on file   Social Determinants of Health   Financial Resource Strain: Not on file  Food Insecurity: Not on file  Transportation Needs: Not on file  Physical Activity: Not on file  Stress: Not on file  Social Connections: Not on file     Family  History: The patient's family history includes Heart attack (age of onset: 56) in his father; Heart attack (age of onset: 76) in his paternal aunt. There is no history of Prostate cancer, Kidney cancer, or Bladder Cancer.  ROS:   Please see the history of present illness.     All other systems reviewed and are negative.  EKGs/Labs/Other Studies Reviewed:    The following studies were reviewed today:   EKG:  EKG is  ordered today.  The ekg ordered today demonstrates normal sinus rhythm, normal ECG.  Recent Labs: 06/18/2020: BUN 15; Creat 0.86; Hemoglobin 14.2; Platelets 237; Potassium 4.3; Sodium 140  Recent Lipid Panel No results found for: CHOL, TRIG, HDL, CHOLHDL, VLDL, LDLCALC, LDLDIRECT   Risk Assessment/Calculations:          Physical Exam:    VS:  BP 140/80 (BP Location: Right Arm, Patient Position: Sitting, Cuff Size: Large)   Pulse 95   Ht 6' (1.829 m)   Wt (!) 505 lb (229.1 kg)   SpO2 98%   BMI 68.49 kg/m     Wt Readings from Last 3 Encounters:  10/03/20 (!) 505 lb (229.1 kg)  10/01/20 (!) 508 lb (230.4 kg)  09/25/20 (!) 495 lb (224.5 kg)     GEN:  Well nourished, well developed in no acute distress HEENT: Normal NECK: No JVD; No carotid bruits LYMPHATICS: No lymphadenopathy CARDIAC: RRR, no murmurs, rubs, gallops RESPIRATORY:  Clear to auscultation without rales, wheezing or rhonchi  ABDOMEN: Soft, non-tender, non-distended MUSCULOSKELETAL:  No edema; No deformity  SKIN: Warm and dry NEUROLOGIC:  Alert and oriented x 3 PSYCHIATRIC:  Normal affect   ASSESSMENT:    1. Precordial pain   2. Primary hypertension   3. Morbid obesity with BMI of 60.0-69.9, adult (HCC)    PLAN:    In order of problems listed above:  Chest pain, risk factors hypertension, obesity, family history of early CAD.  Get echocardiogram, get Lexiscan Myoview to evaluate presence of ischemia.  If symptoms persist, will consider left heart cath due to morbid obesity (ETT and CCTA  not appropriate). Hypertension, BP elevated, but improving.  Continue amlodipine, HCTZ.  HCTZ started 2 days ago. Morbid obesity, low-calorie diet, weight loss advised.  Follow-up after echo and Myoview.   Shared Decision Making/Informed Consent The risks [chest pain, shortness of breath, cardiac arrhythmias, dizziness, blood pressure fluctuations, myocardial infarction, stroke/transient ischemic attack, nausea, vomiting, allergic reaction, radiation exposure, metallic taste sensation and life-threatening complications (estimated to be 1 in 10,000)], benefits (risk stratification, diagnosing coronary artery disease, treatment guidance) and alternatives of a nuclear stress test were discussed in detail with Mr. Tsang and he agrees to proceed.    Medication Adjustments/Labs and Tests Ordered: Current medicines are reviewed at length with the patient today.  Concerns regarding medicines are outlined above.  Orders Placed This Encounter  Procedures   NM Myocar Multi W/Spect W/Wall Motion / EF   EKG 12-Lead   ECHOCARDIOGRAM COMPLETE  No orders of the defined types were placed in this encounter.   Patient Instructions  Medication Instructions:   Your physician recommends that you continue on your current medications as directed. Please refer to the Current Medication list given to you today.  *If you need a refill on your cardiac medications before your next appointment, please call your pharmacy*   Lab Work: None ordered If you have labs (blood work) drawn today and your tests are completely normal, you will receive your results only by: MyChart Message (if you have MyChart) OR A paper copy in the mail If you have any lab test that is abnormal or we need to change your treatment, we will call you to review the results.   Testing/Procedures:   Your physician has requested that you have an echocardiogram. Echocardiography is a painless test that uses sound waves to create  images of your heart. It provides your doctor with information about the size and shape of your heart and how well your heart's chambers and valves are working. This procedure takes approximately one hour. There are no restrictions for this procedure.   2.   [facility] MYOVIEW      Your caregiver has ordered a Stress Test with nuclear imaging. The purpose of this test is to evaluate the blood supply to your heart muscle. This procedure is referred to as a "Non-Invasive Stress Test." This is because other than having an IV started in your vein, nothing is inserted or "invades" your body. Cardiac stress tests are done to find areas of poor blood flow to the heart by determining the extent of coronary artery disease (CAD). Some patients exercise on a treadmill, which naturally increases the blood flow to your heart, while others who are  unable to walk on a treadmill due to physical limitations have a pharmacologic/chemical stress agent called Lexiscan . This medicine will mimic walking on a treadmill by temporarily increasing your coronary blood flow.      PLEASE REPORT TO [facility] MEDICAL MALL ENTRANCE   THE VOLUNTEERS AT THE FIRST DESK WILL DIRECT YOU WHERE TO GO     *Please note: these test may take two appointments over two consecutives days.     Date of Procedure:_____________________________________   Arrival Time for Procedure:______________________________    PLEASE NOTIFY THE OFFICE AT LEAST 24 HOURS IN ADVANCE IF YOU ARE UNABLE TO KEEP YOUR APPOINTMENT.  [phone]  PLEASE NOTIFY NUCLEAR MEDICINE AT [facility] AT LEAST 24 HOURS IN ADVANCE IF YOU ARE UNABLE TO KEEP YOUR APPOINTMENT. [phone]    How to prepare for your Myoview test:        _XX___:  Hold other medications as follows: hydrochlorothiazide (HYDRODIURIL)    1. Flynn not eat or drink after midnight  2. No caffeine for 24 hours prior to test  3. No smoking 24 hours prior to test.  4. Unless instructed otherwise, Take your  medication with a small sips of water.        Follow-Up: At [facility], you and your health needs are our priority.  As part of our continuing mission to provide you with exceptional heart care, we have created designated Provider Care Teams.  These Care Teams include your primary Cardiologist (physician) and Advanced Practice Providers (APPs -  Physician Assistants and Nurse Practitioners) who all work together to provide you with the care you need, when you need it.  We recommend signing up for the patient portal called "MyChart".  Sign up information is provided  on this After Visit Summary.  MyChart is used to connect with patients for Virtual Visits (Telemedicine).  Patients are able to view lab/test results, encounter notes, upcoming appointments, etc.  Non-urgent messages can be sent to your provider as well.   To learn more about what you can Flynn with MyChart, go to [email or web address].    Your next appointment:   Follow up after testing   The format for your next appointment:   In Person  Provider:   You may see Nathan Odea, Flynn or one of the following Advanced Practice Providers on your designated Care Team:   Nicolasa Ducking, NP Eula Listen, PA-C Marisue Ivan, PA-C Cadence [place], [place]   Other Instructions     Signed, Nathan Odea, Flynn  10/03/2020 1:21 PM    [facility]

## 2020-10-10 ENCOUNTER — Telehealth: Payer: Self-pay

## 2020-10-10 NOTE — Telephone Encounter (Signed)
Patient called as he saw that his Myoview was cancelled on his MyChart. I informed him of the details below regarding the weight restrictions.   Patient does have an echo scheduled.  Will route to Dr. Azucena Cecil for review.

## 2020-10-10 NOTE — Telephone Encounter (Signed)
-----   Message from Joline Maxcy sent at 10/10/2020  9:19 AM EDT ----- Regarding: nuc cancelled Nuc med unable to perform due to weight >450.  Asked that we reach out to cancel with patient and discuss alternative POC they did not have a suggestion.

## 2020-10-14 ENCOUNTER — Ambulatory Visit: Payer: 59

## 2020-10-15 ENCOUNTER — Other Ambulatory Visit: Payer: 59

## 2020-10-15 NOTE — Telephone Encounter (Signed)
Debbe Odea, MD  Picard-Tagnolli, Coleen, RN Yesterday (8:27 AM)   Due to weight issues, coronary CTA not appropriate.  Obtain echocardiogram, continue BP medications as prescribed, encouraged weight loss.  Follow-up appointment.  If chest pain persist, despite the above, may consider left heart cath.    Spoke to pt, notified of Dr. Merita Norton recc above.  Pt voiced understanding.  Pt will proceed with Echo 8/9 and will continue current BP medications.  Will forward to scheduling to have f/u appt scheduled.  Pt aware he will receive call back to schedule f/u appt.  Pt will call back with any change in s/s in the meantime.

## 2020-10-22 ENCOUNTER — Other Ambulatory Visit: Payer: Self-pay

## 2020-10-22 ENCOUNTER — Ambulatory Visit
Admission: RE | Admit: 2020-10-22 | Discharge: 2020-10-22 | Disposition: A | Discharge status: Home / Self Care | Payer: 59 | Source: Ambulatory Visit | Attending: Cardiology | Admitting: Cardiology

## 2020-10-22 DIAGNOSIS — I10 Essential (primary) hypertension: Secondary | ICD-10-CM | POA: Diagnosis not present

## 2020-10-22 DIAGNOSIS — R072 Precordial pain: Secondary | ICD-10-CM | POA: Diagnosis present

## 2020-10-22 LAB — ECHOCARDIOGRAM COMPLETE
AR max vel: 3.69 cm2
AV Area VTI: 4.75 cm2
AV Area mean vel: 4.03 cm2
AV Mean grad: 3 mmHg
AV Peak grad: 6.4 mmHg
Ao pk vel: 1.26 m/s
Area-P 1/2: 4.15 cm2
S' Lateral: 2.6 cm

## 2020-10-22 MED ORDER — PERFLUTREN LIPID MICROSPHERE
1.0000 mL | INTRAVENOUS | Status: AC | PRN
  Administered 2020-10-22: 2 mL via INTRAVENOUS
  Filled 2020-10-22: qty 10

## 2020-10-22 NOTE — Progress Notes (Signed)
*  PRELIMINARY RESULTS* Echocardiogram 2D Echocardiogram has been performed.  Cristela Blue 10/22/2020, 11:11 AM

## 2020-10-28 ENCOUNTER — Ambulatory Visit (INDEPENDENT_AMBULATORY_CARE_PROVIDER_SITE_OTHER): Payer: 59 | Admitting: Cardiology

## 2020-10-28 ENCOUNTER — Other Ambulatory Visit: Payer: Self-pay

## 2020-10-28 ENCOUNTER — Encounter: Payer: Self-pay | Admitting: Cardiology

## 2020-10-28 VITALS — BP 140/78 | HR 100 | Ht 72.0 in | Wt >= 6400 oz

## 2020-10-28 DIAGNOSIS — I10 Essential (primary) hypertension: Secondary | ICD-10-CM | POA: Diagnosis not present

## 2020-10-28 DIAGNOSIS — R072 Precordial pain: Secondary | ICD-10-CM | POA: Diagnosis not present

## 2020-10-28 DIAGNOSIS — Z6841 Body Mass Index (BMI) 40.0 and over, adult: Secondary | ICD-10-CM

## 2020-10-28 NOTE — Progress Notes (Signed)
Cardiology Office Note:    Date:  10/28/2020   ID:  Nathan Flynn, DOB 02-Jun-1985, MRN [id number]  PCP:  Smitty Cords, DO   [facility] Providers Cardiologist:  Debbe Odea, MD     Referring MD: Saralyn Pilar *   Chief Complaint  Patient presents with   Other    Follow up post ECHo. Meds reviewed verbally with patient.      History of Present Illness:    Nathan Flynn is a 35 y.o. male with a hx of hypertension, morbid obesity, family history of early CAD who presents for follow-up.  Previously seen due to chest pain.  Symptoms occurred once, overall improved seems better.  States being stressed at the time.  Has occasional chest discomfort not associated with exertion every now and then.  Plans to follow-up with primary care provider later to start a weight loss plan.  Blood pressures have been better since starting medicines.  Prior notes  family history of early CAD in his father who had heart attacks in his 30s.  Has lost a couple of family members this year and also working a lot causing some stress.    Past Medical History:  Diagnosis Date   History of kidney stones    Kidney stone     Past Surgical History:  Procedure Laterality Date   CYSTOSCOPY W/ URETERAL STENT PLACEMENT Left 06/01/2016   Procedure: CYSTOSCOPY WITH STENT  REPLACMENT;  Surgeon: Vanna Scotland, MD;  Location: [facility];  Service: Urology;  Laterality: Left;   CYSTOSCOPY WITH STENT PLACEMENT Left 05/25/2016   Procedure: CYSTOSCOPY WITH STENT PLACEMENT;  Surgeon: Vanna Scotland, MD;  Location: [facility];  Service: Urology;  Laterality: Left;   CYSTOSCOPY/URETEROSCOPY/HOLMIUM LASER/STENT PLACEMENT Right 07/15/2020   Procedure: CYSTOSCOPY/URETEROSCOPY/STENT PLACEMENT;  Surgeon: Vanna Scotland, MD;  Location: [facility];  Service: Urology;  Laterality: Right;   CYSTOSCOPY/URETEROSCOPY/HOLMIUM LASER/STENT PLACEMENT Right 07/22/2020   Procedure:  CYSTOSCOPY/URETEROSCOPY/HOLMIUM LASER/STENT PLACEMENT;  Surgeon: Vanna Scotland, MD;  Location: [facility];  Service: Urology;  Laterality: Right;   none     URETEROSCOPY Left 05/25/2016   Procedure: URETEROSCOPY;  Surgeon: Vanna Scotland, MD;  Location: [facility];  Service: Urology;  Laterality: Left;   URETEROSCOPY WITH HOLMIUM LASER LITHOTRIPSY Left 06/01/2016   Procedure: URETEROSCOPY WITH HOLMIUM LASER LITHOTRIPSY;  Surgeon: Vanna Scotland, MD;  Location: [facility];  Service: Urology;  Laterality: Left;    Current Medications: Current Meds  Medication Sig   amLODipine (NORVASC) 10 MG tablet Take 1 tablet (10 mg total) by mouth daily.   hydrochlorothiazide (HYDRODIURIL) 25 MG tablet Take 1 tablet (25 mg total) by mouth daily.   nitroGLYCERIN (NITROSTAT) 0.4 MG SL tablet Place 1 tablet (0.4 mg total) under the tongue every 5 (five) minutes as needed for chest pain.     Allergies:   Septra [sulfamethoxazole-trimethoprim]   Social History   Socioeconomic History   Marital status: Married    Spouse name: Not on file   Number of children: Not on file   Years of education: Not on file   Highest education level: Not on file  Occupational History   Not on file  Tobacco Use   Smoking status: Never   Smokeless tobacco: Never  Vaping Use   Vaping Use: Never used  Substance and Sexual Activity   Alcohol use: No   Drug use: No   Sexual activity: Not on file  Other Topics Concern   Not on file  Social History Narrative  Not on file   Social Determinants of Health   Financial Resource Strain: Not on file  Food Insecurity: Not on file  Transportation Needs: Not on file  Physical Activity: Not on file  Stress: Not on file  Social Connections: Not on file     Family History: The patient's family history includes Heart attack (age of onset: 70) in his father; Heart attack (age of onset: 69) in his paternal aunt. There is no history of Prostate cancer, Kidney cancer, or Bladder  Cancer.  ROS:   Please see the history of present illness.     All other systems reviewed and are negative.  EKGs/Labs/Other Studies Reviewed:    The following studies were reviewed today:   EKG:  EKG is  ordered today.  The ekg ordered today demonstrates normal sinus rhythm, normal ECG.  Recent Labs: 06/18/2020: BUN 15; Creat 0.86; Hemoglobin 14.2; Platelets 237; Potassium 4.3; Sodium 140  Recent Lipid Panel No results found for: CHOL, TRIG, HDL, CHOLHDL, VLDL, LDLCALC, LDLDIRECT   Risk Assessment/Calculations:          Physical Exam:    VS:  BP 140/78 (BP Location: Left Arm, Patient Position: Sitting, Cuff Size: Large)   Pulse 100   Ht 6' (1.829 m)   Wt (!) 510 lb (231.3 kg)   SpO2 99%   BMI 69.17 kg/m     Wt Readings from Last 3 Encounters:  10/28/20 (!) 510 lb (231.3 kg)  10/03/20 (!) 505 lb (229.1 kg)  10/01/20 (!) 508 lb (230.4 kg)     GEN:  Well nourished, well developed in no acute distress HEENT: Normal NECK: No JVD; No carotid bruits LYMPHATICS: No lymphadenopathy CARDIAC: RRR, no murmurs, rubs, gallops RESPIRATORY:  Clear to auscultation without rales, wheezing or rhonchi  ABDOMEN: Soft, non-tender, non-distended MUSCULOSKELETAL:  No edema; No deformity  SKIN: Warm and dry NEUROLOGIC:  Alert and oriented x 3 PSYCHIATRIC:  Normal affect   ASSESSMENT:    1. Precordial pain   2. Primary hypertension   3. Morbid obesity with BMI of 60.0-69.9, adult (HCC)     PLAN:    In order of problems listed above:  Chest pain, risk factors hypertension, obesity, family history of early CAD.  Echocardiogram showed normal systolic and diastolic function, EF 60 to 65%.  Symptoms overall improved, stress testing not indicated, due to morbid obesity we will not perform ETT, CCTA, Myoview.  If symptoms become more frequent or persist, will plan left heart cath. Hypertension, BP elevated, but improving.  Continue amlodipine, HCTZ.  Morbid obesity, low-calorie diet,  weight loss advised.  Follow-up in 6 months   Medication Adjustments/Labs and Tests Ordered: Current medicines are reviewed at length with the patient today.  Concerns regarding medicines are outlined above.  No orders of the defined types were placed in this encounter.   No orders of the defined types were placed in this encounter.   Patient Instructions  Medication Instructions:  Your physician recommends that you continue on your current medications as directed. Please refer to the Current Medication list given to you today.  *If you need a refill on your cardiac medications before your next appointment, please call your pharmacy*   Lab Work: None ordered If you have labs (blood work) drawn today and your tests are completely normal, you will receive your results only by: MyChart Message (if you have MyChart) OR A paper copy in the mail If you have any lab test that is abnormal or we  need to change your treatment, we will call you to review the results.   Testing/Procedures: None ordered   Follow-Up: At [facility], you and your health needs are our priority.  As part of our continuing mission to provide you with exceptional heart care, we have created designated Provider Care Teams.  These Care Teams include your primary Cardiologist (physician) and Advanced Practice Providers (APPs -  Physician Assistants and Nurse Practitioners) who all work together to provide you with the care you need, when you need it.  We recommend signing up for the patient portal called "MyChart".  Sign up information is provided on this After Visit Summary.  MyChart is used to connect with patients for Virtual Visits (Telemedicine).  Patients are able to view lab/test results, encounter notes, upcoming appointments, etc.  Non-urgent messages can be sent to your provider as well.   To learn more about what you can do with MyChart, go to [email or web address].    Your next appointment:   6  month(s)  The format for your next appointment:   In Person  Provider:   You may see Debbe Odea, MD or one of the following Advanced Practice Providers on your designated Care Team:   Nicolasa Ducking, NP Eula Listen, PA-C Marisue Ivan, PA-C Cadence Bovey, [place]   Other Instructions    Signed, Debbe Odea, MD  10/28/2020 11:40 AM    [facility]

## 2020-10-28 NOTE — Patient Instructions (Signed)
Medication Instructions:  Your physician recommends that you continue on your current medications as directed. Please refer to the Current Medication list given to you today.  *If you need a refill on your cardiac medications before your next appointment, please call your pharmacy*   Lab Work: None ordered If you have labs (blood work) drawn today and your tests are completely normal, you will receive your results only by: MyChart Message (if you have MyChart) OR A paper copy in the mail If you have any lab test that is abnormal or we need to change your treatment, we will call you to review the results.   Testing/Procedures: None ordered   Follow-Up: At [facility], you and your health needs are our priority.  As part of our continuing mission to provide you with exceptional heart care, we have created designated Provider Care Teams.  These Care Teams include your primary Cardiologist (physician) and Advanced Practice Providers (APPs -  Physician Assistants and Nurse Practitioners) who all work together to provide you with the care you need, when you need it.  We recommend signing up for the patient portal called "MyChart".  Sign up information is provided on this After Visit Summary.  MyChart is used to connect with patients for Virtual Visits (Telemedicine).  Patients are able to view lab/test results, encounter notes, upcoming appointments, etc.  Non-urgent messages can be sent to your provider as well.   To learn more about what you can do with MyChart, go to [email or web address].    Your next appointment:   6 month(s)  The format for your next appointment:   In Person  Provider:   You may see Brian Agbor-Etang, MD or one of the following Advanced Practice Providers on your designated Care Team:   Christopher Berge, NP Ryan Dunn, PA-C Jacquelyn Visser, PA-C Cadence Furth, PA-C   Other Instructions   

## 2020-11-14 ENCOUNTER — Ambulatory Visit: Payer: 59 | Admitting: Cardiology

## 2020-12-09 ENCOUNTER — Other Ambulatory Visit: Payer: Self-pay

## 2020-12-09 ENCOUNTER — Ambulatory Visit (INDEPENDENT_AMBULATORY_CARE_PROVIDER_SITE_OTHER): Payer: 59 | Admitting: Family Medicine

## 2020-12-09 ENCOUNTER — Encounter: Payer: Self-pay | Admitting: Family Medicine

## 2020-12-09 VITALS — BP 179/91 | HR 87 | Ht 72.0 in | Wt >= 6400 oz

## 2020-12-09 DIAGNOSIS — I10 Essential (primary) hypertension: Secondary | ICD-10-CM | POA: Diagnosis not present

## 2020-12-09 DIAGNOSIS — Z6841 Body Mass Index (BMI) 40.0 and over, adult: Secondary | ICD-10-CM | POA: Diagnosis not present

## 2020-12-09 DIAGNOSIS — R072 Precordial pain: Secondary | ICD-10-CM

## 2020-12-09 NOTE — Assessment & Plan Note (Signed)
Still elevated BP. But improved Recent new dx HTN Secondary to morbid obesity    Plan:  1. Continue current regimen Amlodipine 10mg  daily and HCTZ 25mg  daily for now - discussed other 3rd medication options we will defer today, goal for wt management first to see if BP improves w/ weight loss  Considering secondary causes also like OSA - discussed today, not meeting criteria for OSA but still suspicious if feeling daytime sleepiness and with obesity. Will revisit this as well if indicated  Encourage improved lifestyle - low sodium diet, regular exercise Continue to monitor BP outside office, bring readings to next visit, if persistently >140/90 or new symptoms notify office sooner

## 2020-12-09 NOTE — Progress Notes (Signed)
Subjective:    Patient ID: Nathan Flynn, male    DOB: Jul 26, 1985, 35 y.o.   MRN: [id number]  Nathan Flynn is a 35 y.o. male presenting on 12/09/2020 for Hypertension and chest discomfort   HPI  Precordial Chest Pain / Atypical Last visit with me 09/2020, he was referred to Cardiology. Now today Following up after establish with Cardiology [facility] - Interval updates now with he had initial testing from Cardiology including EKG and ECHO, results reviewed below, normal results and cardiac function on ECHO, they were unable to do stress NM test due to his weight however deferred for now and would reconsider Cardiac Cath in future if indicated  New sensation / episode onset within 1 week with a mild soreness within Left chest and would present if he leaned forward would increase intensity but palpation did not trigger it. Not nearly as severe as previous. This was more mild. Unrelated to eating PO intake, heartburn or digestive symptoms  He still is endorsing multiple significant stressors, family and medical history. He has multiple family history of heart disease, Father had MI passed age 63, and paternal Aunt in 90s with MI.  Denies any chest pain currently, dyspnea, nausea vomiting, sweating, headache   CHRONIC HTN: Since last visit has improved BP but still has avg higher than goal. Since last visit HCTZ was added now to regimen. Home avg BP 163/73, twice a day. Current Meds - Amlodipine 10mg  daily, HCTZ 25mg  daily Denies CP, dyspnea, HA, edema, dizziness / lightheadedness  Morbid Obesity BMI >69 Goal to improve lifestyle He has treadmill and weights at home for increase exercise At work he is either sedentary at desk, otherwise heavy lifting. No particular precise diet / regimen medication tried yet.   Depression screen [facility] 2/9 10/01/2020 07/30/2020  Decreased Interest 1 1  Down, Depressed, Hopeless 1 0  PHQ - 2 Score 2 1  Altered sleeping 0 0  Tired, decreased  energy 3 3  Change in appetite 0 2  Feeling bad or failure about yourself  0 0  Trouble concentrating 0 1  Moving slowly or fidgety/restless 0 0  Suicidal thoughts 0 0  PHQ-9 Score 5 7  Difficult doing work/chores Not difficult at all Somewhat difficult    Social History   Tobacco Use   Smoking status: Never   Smokeless tobacco: Never  Vaping Use   Vaping Use: Never used  Substance Use Topics   Alcohol use: No   Drug use: No    Review of Systems Per HPI unless specifically indicated above     Objective:    BP (!) 179/91   Pulse 87   Ht 6' (1.829 m)   Wt (!) 510 lb 8 oz (231.6 kg)   SpO2 98%   BMI 69.24 kg/m   Wt Readings from Last 3 Encounters:  12/09/20 (!) 510 lb 8 oz (231.6 kg)  10/28/20 (!) 510 lb (231.3 kg)  10/03/20 (!) 505 lb (229.1 kg)    Physical Exam Vitals and nursing note reviewed.  Constitutional:      General: He is not in acute distress.    Appearance: He is well-developed. He is obese. He is not diaphoretic.     Comments: Well-appearing, comfortable, cooperative  HENT:     Head: Normocephalic and atraumatic.  Eyes:     General:        Right eye: No discharge.        Left eye: No discharge.  Conjunctiva/sclera: Conjunctivae normal.     Pupils: Pupils are equal, round, and reactive to light.  Neck:     Thyroid: No thyromegaly.  Cardiovascular:     Rate and Rhythm: Normal rate and regular rhythm.     Pulses: Normal pulses.     Heart sounds: Normal heart sounds. No murmur heard. Pulmonary:     Effort: Pulmonary effort is normal. No respiratory distress.     Breath sounds: Normal breath sounds. No wheezing or rales.  Abdominal:     General: Bowel sounds are normal. There is no distension.     Palpations: Abdomen is soft. There is no mass.     Tenderness: There is no abdominal tenderness.  Musculoskeletal:        General: No tenderness. Normal range of motion.     Cervical back: Normal range of motion and neck supple.     Comments:  Upper / Lower Extremities: - Normal muscle tone, strength bilateral upper extremities 5/5, lower extremities 5/5  Lymphadenopathy:     Cervical: No cervical adenopathy.  Skin:    General: Skin is warm and dry.     Findings: No erythema or rash.  Neurological:     Mental Status: He is alert and oriented to person, place, and time.     Comments: Distal sensation intact to light touch all extremities  Psychiatric:        Mood and Affect: Mood normal.        Behavior: Behavior normal.        Thought Content: Thought content normal.     Comments: Well groomed, good eye contact, normal speech and thoughts     ECHOCARDIOGRAM 10/22/2020 [facility] Results  Procedure Component Value Ref Range Date/Time  ECHOCARDIOGRAM COMPLETE [[id number]] Collected: 10/22/20 1013  Order Status: Completed Updated: 10/22/20 1152   Ao pk vel 1.26 m/s    AV Area VTI 4.75 cm2    AR max vel 3.69 cm2    AV Mean grad 3.0 mmHg    AV Peak grad 6.4 mmHg    S' Lateral 2.60 cm    AV Area mean vel 4.03 cm2    Area-P 1/2 4.15 cm2   Narrative:        ECHOCARDIOGRAM REPORT         Patient Name:   Nathan Flynn Date of Exam: 10/22/2020  Medical Rec #:  [id number]               Height:       72.0 in  Accession #:    [id number]              Weight:       505.0 lb  Date of Birth:  1985-04-18                BSA:          3.158 m  Patient Age:    35 years                BP:           140/80 mmHg  Patient Gender: M                       HR:           95 bpm.  Exam Location:  [facility]   Procedure: 2D Echo, Cardiac Doppler, Color Doppler and Intracardiac  Opacification Agent   Indications:     Chest pain R07.9     History:         Patient has no prior history of Echocardiogram examinations.                   Risk Factors:Hypertension.     Sonographer:     Cristela Blue RDCS (AE)  Referring Phys:  [id number] Debbe Odea  Diagnosing Phys: Debbe Odea MD       Sonographer Comments: Technically challenging study due to limited acoustic windows, suboptimal apical window and suboptimal parasternal window.  IMPRESSIONS     1. Left ventricular ejection fraction, by estimation, is 60 to 65%. The left ventricle has normal function. The left ventricle has no regional wall motion abnormalities. Left ventricular diastolic parameters were normal.   2. Right ventricular systolic function is normal. The right ventricular size is normal.   3. The mitral valve is grossly normal. No evidence of mitral valve regurgitation.   4. The aortic valve was not well visualized. Aortic valve regurgitation is not visualized.   FINDINGS   Left Ventricle: Left ventricular ejection fraction, by estimation, is 60 to 65%. The left ventricle has normal function. The left ventricle has no regional wall motion abnormalities. Definity contrast agent was given IV to delineate the left ventricular   endocardial borders. The left ventricular internal cavity size was normal in size. There is no left ventricular hypertrophy. Left ventricular diastolic parameters were normal.   Right Ventricle: The right ventricular size is normal. No increase in right ventricular wall thickness. Right ventricular systolic function is normal.   Left Atrium: Left atrial size was normal in size.   Right Atrium: Right atrial size was normal in size.   Pericardium: There is no evidence of pericardial effusion.   Mitral Valve: The mitral valve is grossly normal. No evidence of mitral valve regurgitation.   Tricuspid Valve: The tricuspid valve is not well visualized. Tricuspid valve regurgitation is not demonstrated.   Aortic Valve: The aortic valve was not well visualized. Aortic valve regurgitation is not visualized. Aortic valve mean gradient measures 3.0 mmHg. Aortic valve peak gradient measures 6.4 mmHg. Aortic valve area, by VTI measures 4.75 cm.   Pulmonic Valve: The pulmonic valve was not well  visualized. Pulmonic valve regurgitation is not visualized.   Aorta: The aortic root is normal in size and structure.   Venous: The inferior vena cava was not well visualized.   IAS/Shunts: No atrial level shunt detected by color flow Doppler.      LEFT VENTRICLE  PLAX 2D  LVIDd:         4.21 cm  Diastology  LVIDs:         2.60 cm  LV e' medial:    8.38 cm/s  LV PW:         1.60 cm  LV E/e' medial:  10.2  LV IVS:        1.46 cm  LV e' lateral:   12.20 cm/s  LVOT diam:     2.30 cm  LV E/e' lateral: 7.0  LV SV:         93  LV SV Index:   30  LVOT Area:     4.15 cm      RIGHT VENTRICLE  RV Basal diam:  3.93 cm   LEFT ATRIUM             Index       RIGHT ATRIUM  Index  LA diam:        4.60 cm 1.46 cm/m  RA Area:     19.70 cm  LA Vol (A2C):   67.6 ml 21.40 ml/m RA Volume:   53.40 ml  16.91 ml/m  LA Vol (A4C):   52.2 ml 16.53 ml/m  LA Biplane Vol: 60.7 ml 19.22 ml/m   AORTIC VALVE                   PULMONIC VALVE  AV Area (Vmax):    3.69 cm    PV Vmax:        0.87 m/s  AV Area (Vmean):   4.03 cm    PV Peak grad:   3.1 mmHg  AV Area (VTI):     4.75 cm    RVOT Peak grad: 6 mmHg  AV Vmax:           126.00 cm/s  AV Vmean:          81.600 cm/s  AV VTI:            0.197 m  AV Peak Grad:      6.4 mmHg  AV Mean Grad:      3.0 mmHg  LVOT Vmax:         112.00 cm/s  LVOT Vmean:        79.200 cm/s  LVOT VTI:          0.225 m  LVOT/AV VTI ratio: 1.14     AORTA  Ao Root diam: 3.10 cm   MITRAL VALVE               TRICUSPID VALVE  MV Area (PHT): 4.15 cm    TR Peak grad:   5.7 mmHg  MV Decel Time: 183 msec    TR Vmax:        119.00 cm/s  MV E velocity: 85.70 cm/s  MV A velocity: 82.70 cm/s  SHUNTS  MV E/A ratio:  1.04        Systemic VTI:  0.22 m                             Systemic Diam: 2.30 cm   Debbe Odea MD  Electronically signed by Debbe Odea MD  Signature Date/Time: 10/22/2020/11:52:11 AM         Final       Results for orders placed or  performed during the hospital encounter of 10/22/20  ECHOCARDIOGRAM COMPLETE  Result Value Ref Range   Ao pk vel 1.26 m/s   AV Area VTI 4.75 cm2   AR max vel 3.69 cm2   AV Mean grad 3.0 mmHg   AV Peak grad 6.4 mmHg   S' Lateral 2.60 cm   AV Area mean vel 4.03 cm2   Area-P 1/2 4.15 cm2      Assessment & Plan:   Problem List Items Addressed This Visit     Morbid obesity with BMI of 60.0-69.9, adult (HCC) - Primary   Essential hypertension    Morbid obesity BMI >69 HTN as complication  Precordial chest pain - worked up by Cardiology and reassurance given with negative ECHO and initial work up, they will f/u with him if recurrent symptoms and consider LH Cath in future. He understands that if repeat symptoms or severe concerns return to Cardiology for more testing. At this time, reassurance that no obvious ischemic etiology present.  See A&P above  for HTN  For obesity We discussed lifestyle diet exercise modifications, he already has a lot of plans in place for this but needs help moving forward, he has barriers with time etc.  Considering medication as first option. Looking at GLP1 therapy will check labs to triage if A1c is PreDm or DM vs normal range.  May use Saxenda if normal range vs Ozempic if PreDM / DM  Can get free sample when we correspond, labs are on 9/29  Handout given on [facility] [place] in [place] as a good option but may be limited by time availability.  Future reconsider Bariatric program if interested, as discussed importance of weight loss at this time to prevent future complications.  No orders of the defined types were placed in this encounter.     Follow up plan: Return in about 3 days (around 12/12/2020) for fasting lab only 12/12/20 Thurs 8am.   Saralyn Pilar, DO [facility] 12/09/2020, 3:15 PM

## 2020-12-09 NOTE — Patient Instructions (Addendum)
Thank you for coming to the office today.  If Elevated sugar pre diabetes or diabetes we can order Ozempic injection weekly  If Non diabetic we can order Saxenda or Arville Lime is daily injection and Reginal Lutes is weekly.  We can get these newer meds at low cost if you are interested.  -----------------------------   WEIGHT MANAGEMENT  They should offer informational sessions if interested  Dr Quillian Quince  [facility] [address] [place], [place] [address] Ph: [phone]  ----------------------------------  Check into other diet regimens  App based such as Noom  Can do Weight Watchers  Calorie restriction limit / low carb or keto diet options.   Please schedule a Follow-up Appointment to: Return in about 3 days (around 12/12/2020) for fasting lab only 12/12/20 Thurs 8am.  If you have any other questions or concerns, please feel free to call the office or send a message through MyChart. You may also schedule an earlier appointment if necessary.  Additionally, you may be receiving a survey about your experience at our office within a few days to 1 week by e-mail or mail. We value your feedback.  Saralyn Pilar, DO [facility], [place]

## 2020-12-11 ENCOUNTER — Other Ambulatory Visit: Payer: Self-pay

## 2020-12-11 DIAGNOSIS — I10 Essential (primary) hypertension: Secondary | ICD-10-CM

## 2020-12-12 ENCOUNTER — Other Ambulatory Visit: Payer: 59

## 2020-12-12 ENCOUNTER — Other Ambulatory Visit: Payer: Self-pay

## 2020-12-13 LAB — COMPLETE METABOLIC PANEL WITH GFR
AG Ratio: 1.4 (calc) (ref 1.0–2.5)
ALT: 29 U/L (ref 9–46)
AST: 20 U/L (ref 10–40)
Albumin: 4.4 g/dL (ref 3.6–5.1)
Alkaline phosphatase (APISO): 54 U/L (ref 36–130)
BUN: 19 mg/dL (ref 7–25)
CO2: 29 mmol/L (ref 20–32)
Calcium: 9.4 mg/dL (ref 8.6–10.3)
Chloride: 101 mmol/L (ref 98–110)
Creat: 0.82 mg/dL (ref 0.60–1.26)
Globulin: 3.1 g/dL (calc) (ref 1.9–3.7)
Glucose, Bld: 102 mg/dL — ABNORMAL HIGH (ref 65–99)
Potassium: 4.5 mmol/L (ref 3.5–5.3)
Sodium: 140 mmol/L (ref 135–146)
Total Bilirubin: 0.6 mg/dL (ref 0.2–1.2)
Total Protein: 7.5 g/dL (ref 6.1–8.1)
eGFR: 116 mL/min/{1.73_m2} (ref >=60)

## 2020-12-13 LAB — TSH: TSH: 1.97 mIU/L (ref 0.40–4.50)

## 2020-12-13 LAB — LIPID PANEL
Cholesterol: 169 mg/dL (ref <200)
HDL: 44 mg/dL (ref >=40)
LDL Cholesterol (Calc): 110 mg/dL (calc) — ABNORMAL HIGH
Non-HDL Cholesterol (Calc): 125 mg/dL (calc) (ref <130)
Total CHOL/HDL Ratio: 3.8 (calc) (ref <5.0)
Triglycerides: 63 mg/dL (ref <150)

## 2020-12-13 LAB — HEMOGLOBIN A1C
Hgb A1c MFr Bld: 5.6 % of total Hgb (ref <5.7)
Mean Plasma Glucose: 114 mg/dL
eAG (mmol/L): 6.3 mmol/L

## 2020-12-22 ENCOUNTER — Encounter: Payer: Self-pay | Admitting: Family Medicine

## 2020-12-22 DIAGNOSIS — Z6841 Body Mass Index (BMI) 40.0 and over, adult: Secondary | ICD-10-CM

## 2020-12-26 ENCOUNTER — Other Ambulatory Visit: Payer: Self-pay | Admitting: Family Medicine

## 2020-12-26 DIAGNOSIS — I10 Essential (primary) hypertension: Secondary | ICD-10-CM

## 2020-12-26 NOTE — Telephone Encounter (Signed)
Requested Prescriptions  Pending Prescriptions Disp Refills  . hydrochlorothiazide (HYDRODIURIL) 25 MG tablet [Pharmacy Med Name: HYDROCHLOROTHIAZIDE 25MG  TABLETS] 90 tablet 1    Sig: TAKE 1 TABLET(25 MG) BY MOUTH DAILY     Cardiovascular: Diuretics - Thiazide Failed - 12/26/2020  3:20 AM      Failed - Last BP in normal range    BP Readings from Last 1 Encounters:  12/09/20 (!) 179/91         Passed - Ca in normal range and within 360 days    Calcium  Date Value Ref Range Status  12/12/2020 9.4 8.6 - 10.3 mg/dL Final         Passed - Cr in normal range and within 360 days    Creat  Date Value Ref Range Status  12/12/2020 0.82 0.60 - 1.26 mg/dL Final         Passed - K in normal range and within 360 days    Potassium  Date Value Ref Range Status  12/12/2020 4.5 3.5 - 5.3 mmol/L Final         Passed - Na in normal range and within 360 days    Sodium  Date Value Ref Range Status  12/12/2020 140 135 - 146 mmol/L Final         Passed - Valid encounter within last 6 months    Recent Outpatient Visits          2 weeks ago Morbid obesity with BMI of 60.0-69.9, adult [facility])   [facility], DO   2 months ago Essential hypertension   [facility] [place], [place], DO   4 months ago Essential hypertension   [facility], DO   6 months ago Dysuria   [facility], Althea Charon, DO      Future Appointments            In 4 months Agbor-Etang, Netta Neat, MD [facility]   In 9 months [facility], MD [facility]

## 2021-01-02 ENCOUNTER — Other Ambulatory Visit: Payer: Self-pay

## 2021-01-02 DIAGNOSIS — I10 Essential (primary) hypertension: Secondary | ICD-10-CM

## 2021-01-02 MED ORDER — HYDROCHLOROTHIAZIDE 25 MG PO TABS: ORAL_TABLET | ORAL | 2 refills | Status: DC

## 2021-01-07 MED ORDER — UNIFINE PENTIPS 31G X 8 MM MISC: 3 refills | Status: AC

## 2021-01-22 MED ORDER — SAXENDA 18 MG/3ML ~~LOC~~ SOPN: PEN_INJECTOR | SUBCUTANEOUS | 2 refills | Status: DC

## 2021-01-22 NOTE — Addendum Note (Signed)
Addended by: Smitty Cords on: 01/22/2021 01:06 PM   Modules accepted: Orders

## 2021-01-24 ENCOUNTER — Other Ambulatory Visit: Payer: Self-pay | Admitting: Family Medicine

## 2021-01-24 DIAGNOSIS — I10 Essential (primary) hypertension: Secondary | ICD-10-CM

## 2021-01-24 NOTE — Telephone Encounter (Signed)
Requested Prescriptions  Pending Prescriptions Disp Refills  . amLODipine (NORVASC) 10 MG tablet [Pharmacy Med Name: AMLODIPINE BESYLATE 10 MG TAB] 90 tablet 1    Sig: TAKE 1 TABLET BY MOUTH EVERY DAY     Cardiovascular:  Calcium Channel Blockers Failed - 01/24/2021  2:49 AM      Failed - Last BP in normal range    BP Readings from Last 1 Encounters:  12/09/20 (!) 179/91         Passed - Valid encounter within last 6 months    Recent Outpatient Visits          1 month ago Morbid obesity with BMI of 60.0-69.9, adult [facility])   [facility] [place], Netta Neat, DO   3 months ago Essential hypertension   [facility] [place], Netta Neat, DO   5 months ago Essential hypertension   [facility] Smitty Cords, DO   7 months ago Dysuria   [facility] Althea Charon, Netta Neat, DO      Future Appointments            In 3 months Agbor-Etang, Arlys John, MD [facility]   In 8 months Vanna Scotland, MD [facility]           \

## 2021-02-13 ENCOUNTER — Encounter: Payer: Self-pay | Admitting: Family Medicine

## 2021-02-13 DIAGNOSIS — I10 Essential (primary) hypertension: Secondary | ICD-10-CM

## 2021-02-13 MED ORDER — HYDROCHLOROTHIAZIDE 25 MG PO TABS: ORAL_TABLET | ORAL | 2 refills | Status: DC

## 2021-05-01 ENCOUNTER — Ambulatory Visit: Payer: 59 | Admitting: Cardiology

## 2021-05-03 ENCOUNTER — Encounter: Payer: Self-pay | Admitting: Family Medicine

## 2021-05-27 ENCOUNTER — Other Ambulatory Visit: Payer: Self-pay

## 2021-05-27 ENCOUNTER — Encounter: Payer: Self-pay | Admitting: Family Medicine

## 2021-05-27 ENCOUNTER — Other Ambulatory Visit: Payer: Self-pay | Admitting: Family Medicine

## 2021-05-27 ENCOUNTER — Ambulatory Visit (INDEPENDENT_AMBULATORY_CARE_PROVIDER_SITE_OTHER): Payer: 59 | Admitting: Family Medicine

## 2021-05-27 VITALS — BP 160/90 | HR 94 | Ht 72.0 in | Wt >= 6400 oz

## 2021-05-27 DIAGNOSIS — I10 Essential (primary) hypertension: Secondary | ICD-10-CM

## 2021-05-27 DIAGNOSIS — Z0000 Encounter for general adult medical examination without abnormal findings: Secondary | ICD-10-CM

## 2021-05-27 DIAGNOSIS — H6983 Other specified disorders of Eustachian tube, bilateral: Secondary | ICD-10-CM | POA: Diagnosis not present

## 2021-05-27 DIAGNOSIS — R7309 Other abnormal glucose: Secondary | ICD-10-CM

## 2021-05-27 DIAGNOSIS — K760 Fatty (change of) liver, not elsewhere classified: Secondary | ICD-10-CM

## 2021-05-27 DIAGNOSIS — E7800 Pure hypercholesterolemia, unspecified: Secondary | ICD-10-CM

## 2021-05-27 DIAGNOSIS — Z6841 Body Mass Index (BMI) 40.0 and over, adult: Secondary | ICD-10-CM

## 2021-05-27 MED ORDER — FLUTICASONE PROPIONATE 50 MCG/ACT NA SUSP: 2.0000 | Freq: Every day | NASAL | 3 refills | Status: DC

## 2021-05-27 MED ORDER — AMLODIPINE BESYLATE 10 MG PO TABS: 10.0000 mg | ORAL_TABLET | Freq: Every day | ORAL | 3 refills | Status: DC

## 2021-05-27 MED ORDER — SAXENDA 18 MG/3ML ~~LOC~~ SOPN
1.8000 mg | PEN_INJECTOR | Freq: Every day | SUBCUTANEOUS | 2 refills | Status: DC
Start: 2021-05-27 — End: 2021-07-01

## 2021-05-27 MED ORDER — HYDROCHLOROTHIAZIDE 25 MG PO TABS: 25.0000 mg | ORAL_TABLET | Freq: Every day | ORAL | 3 refills | Status: DC

## 2021-05-27 NOTE — Progress Notes (Signed)
? ?Subjective:  ? ? Patient ID: Nathan Flynn, male    DOB: 06-15-1985, 36 y.o.   MRN: [id number] ? ?Nathan Flynn is a 36 y.o. male presenting on 05/27/2021 for Weight Check ? ? ?HPI ? ?CHRONIC HTN: ?Elevated BP ?Current Meds - Amlodipine 37m daily, HCTZ 256mdaily ?OUT of meds currently needs to restart ?Denies CP, dyspnea, HA, edema, dizziness / lightheadedness ? ?No chest pain in past few months. ? ? ?Morbid Obesity BMI >67 ?Weight down 10 lbs since onset medicine, lost more but gained some back recently ?Goal to improve lifestyle ?He has treadmill and weights at home for increase exercise ?At work he is either sedentary at desk, otherwise heavy lifting. ? ?Last visit 12/09/20, he was started on Saxenda, gradual titrate up on dosage, by time he made it to max dose 72m48maily inj, he felt significant sickness with nausea vomiting unable to eat regularly and could not tolerate PO. He would stop then wait a week. ? ?He has maintained dose now [place] injection ? ?He does not have severe side effects now. But he has symptoms of abdominal / digestive discomfort in morning. ? ?Overall notable increased improvement with medication overall with some weight loss. He did lose significant amount of weight initially then by time he reached maximum dose up to 72mg 9mage, but he had significant side effect with nausea vomiting and intolerance. Has scaled back. ? ?Now he feels less hungry at mealtime. And is trying to improve. ? ? ?Eustachian Tube Dysfunction ?Admits L ear pain pressure fullness at times. Some sinus related symptoms ? ?[facility] 10/01/2020 07/30/2020  ?Decreased Interest _0 ?Down, Depressed, Hopeless 1 1 0  ?PHQ - 2 Score _1 ?Altered sleeping 0 0 0  ?Tired, decreased energy _2 ?Change in appetite 0 0 2  ?Feeling bad or failure about yourself  0 0 0  ?Trouble concentrating 0 0 1  ?Moving slowly or fidgety/restless 0 0 0  ?Suicidal thoughts 0 0 0   ?PHQ-9 Score _3 ?Difficult doing work/chores Not difficult at all Not difficult at all Somewhat difficult  ? ? ?Social History  ? ?Tobacco Use  ? Smoking status: Never  ? Smokeless tobacco: Never  ?Vaping Use  ? Vaping Use: Never used  ?Substance Use Topics  ? Alcohol use: No  ? Drug use: No  ? ? ?Review of Systems ?Per HPI unless specifically indicated above ? ?   ?Objective:  ?  ?BP (!) 160/90 (BP Location: Left Arm, Cuff Size: Large)   Pulse 94   Ht 6' (1.829 m)   Wt (!) 501 lb (227.3 kg)   SpO2 100%   BMI 67.95 kg/m?   ?Wt Readings from Last 3 Encounters:  ?05/27/21 (!) 501 lb (227.3 kg)  ?12/09/20 (!) 510 lb 8 oz (231.6 kg)  ?10/28/20 (!) 510 lb (231.3 kg)  ?  ?Physical Exam ?Vitals and nursing note reviewed.  ?Constitutional:   ?   General: He is not in acute distress. ?   Appearance: Normal appearance. He is well-developed. He is obese. He is not diaphoretic.  ?   Comments: Well-appearing, comfortable, cooperative  ?HENT:  ?   Head: Normocephalic and atraumatic.  ?Eyes:  ?   General:     ?   Right eye: No discharge.     ?   Left eye: No discharge.  ?   Conjunctiva/sclera: Conjunctivae  normal.  ?Cardiovascular:  ?   Rate and Rhythm: Normal rate.  ?Pulmonary:  ?   Effort: Pulmonary effort is normal.  ?Skin: ?   General: Skin is warm and dry.  ?   Findings: No erythema or rash.  ?Neurological:  ?   Mental Status: He is alert and oriented to person, place, and time.  ?Psychiatric:     ?   Mood and Affect: Mood normal.     ?   Behavior: Behavior normal.     ?   Thought Content: Thought content normal.  ?   Comments: Well groomed, good eye contact, normal speech and thoughts  ? ? ? ? ?Results for orders placed or performed in visit on 12/11/20  ?TSH  ?Result Value Ref Range  ? TSH 1.97 0.40 - 4.50 mIU/L  ?Hemoglobin A1c  ?Result Value Ref Range  ? Hgb A1c MFr Bld 5.6 <5.7 % of total Hgb  ? Mean Plasma Glucose 114 mg/dL  ? eAG (mmol/L) 6.3 mmol/L  ?Lipid panel  ?Result Value Ref Range  ? Cholesterol 169  <200 mg/dL  ? HDL 44 > OR = 40 mg/dL  ? Triglycerides 63 <150 mg/dL  ? LDL Cholesterol (Calc) 110 (H) mg/dL (calc)  ? Total CHOL/HDL Ratio 3.8 <5.0 (calc)  ? Non-HDL Cholesterol (Calc) 125 <130 mg/dL (calc)  ?COMPLETE METABOLIC PANEL WITH GFR  ?Result Value Ref Range  ? Glucose, Bld 102 (H) 65 - 99 mg/dL  ? BUN 19 7 - 25 mg/dL  ? Creat 0.82 0.60 - 1.26 mg/dL  ? eGFR 116 > OR = 60 mL/min/1.41m  ? BUN/Creatinine Ratio NOT APPLICABLE 6 - 22 (calc)  ? Sodium 140 135 - 146 mmol/L  ? Potassium 4.5 3.5 - 5.3 mmol/L  ? Chloride 101 98 - 110 mmol/L  ? CO2 29 20 - 32 mmol/L  ? Calcium 9.4 8.6 - 10.3 mg/dL  ? Total Protein 7.5 6.1 - 8.1 g/dL  ? Albumin 4.4 3.6 - 5.1 g/dL  ? Globulin 3.1 1.9 - 3.7 g/dL (calc)  ? AG Ratio 1.4 1.0 - 2.5 (calc)  ? Total Bilirubin 0.6 0.2 - 1.2 mg/dL  ? Alkaline phosphatase (APISO) 54 36 - 130 U/L  ? AST 20 10 - 40 U/L  ? ALT 29 9 - 46 U/L  ? ?   ?Assessment & Plan:  ? ?Problem List Items Addressed This Visit   ? ? Morbid obesity with BMI of 60.0-69.9, adult [place]  ? Relevant Medications  ? SAXENDA 18 MG/3ML SOPN  ? Essential hypertension  ? Relevant Medications  ? amLODipine (NORVASC) 10 MG tablet  ? hydrochlorothiazide (HYDRODIURIL) 25 MG tablet  ? ?Other Visit Diagnoses   ? ? Eustachian tube dysfunction, bilateral      ? Relevant Medications  ? fluticasone (FLONASE) 50 MCG/ACT nasal spray  ? ?  ?  ?HTN ?Elevated persistently off medications, uncontrolled ?Restart medications today Amlodipine 159mand HCTZ 2580maily - discussed med adherence remembering to take meds. ? ?Eustachian Tube Dysfunction ?L>R pressure due to sinuses but no sign of infection ?Start nasal steroid Flonase 2 sprays in each nostril daily for 4-6 weeks, may repeat course seasonally or as needed ? ?Morbid Obesity BMI >67 ?Encouraged with initial wt loss on Saxenda GLP ?GI intolerance at max dose 3mg44meduced to 1.2 ?Now goal to inc back to 1.8mg 109mly inj, new order sent ?Encourage continue lifestyle modification  as well in conjunction ?Future reconsider consultation / referral if need  for wt management ? ? ? ?Meds ordered this encounter  ?Medications  ? SAXENDA 18 MG/3ML SOPN  ?  Sig: Inject 1.8 mg into the skin daily.  ?  Dispense:  15 mL  ?  Refill:  2  ? amLODipine (NORVASC) 10 MG tablet  ?  Sig: Take 1 tablet (10 mg total) by mouth daily.  ?  Dispense:  90 tablet  ?  Refill:  3  ? hydrochlorothiazide (HYDRODIURIL) 25 MG tablet  ?  Sig: Take 1 tablet (25 mg total) by mouth daily.  ?  Dispense:  90 tablet  ?  Refill:  3  ? fluticasone (FLONASE) 50 MCG/ACT nasal spray  ?  Sig: Place 2 sprays into both nostrils daily. Use for 4-6 weeks then stop and use seasonally or as needed.  ?  Dispense:  16 g  ?  Refill:  3  ? ? ? ?Follow up plan: ?Return in about 6 months (around 11/27/2021) for 6 month fasting lab only then 1 week later Annual Physical. ? ?Future 11/2021 ? ?Nobie Putnam, DO ?[facility] ?[facility] ?05/27/2021, 3:04 PM ?

## 2021-05-27 NOTE — Patient Instructions (Addendum)
Thank you for coming to the office today. ? ?Keep on Saxenda 1.8mg  daily injection. ? ?Start nasal steroid Flonase 2 sprays in each nostril daily for 4-6 weeks, may repeat course seasonally or as needed ? ?Restart BP medications ? ?DUE for FASTING BLOOD WORK (no food or drink after midnight before the lab appointment, only water or coffee without cream/sugar on the morning of) ? ?SCHEDULE "Lab Only" visit in the morning at the clinic for lab draw in 6 MONTHS  ? ?- Make sure Lab Only appointment is at about 1 week before your next appointment, so that results will be available ? ?For Lab Results, once available within 2-3 days of blood draw, you can can log in to MyChart online to view your results and a brief explanation. Also, we can discuss results at next follow-up visit. ? ? ? ?Please schedule a Follow-up Appointment to: Return in about 6 months (around 11/27/2021) for 6 month fasting lab only then 1 week later Annual Physical. ? ?If you have any other questions or concerns, please feel free to call the office or send a message through [place]. You may also schedule an earlier appointment if necessary. ? ?Additionally, you may be receiving a survey about your experience at our office within a few days to 1 week by e-mail or mail. We value your feedback. ? ?Nobie Putnam, DO ?[place] ?

## 2021-06-03 ENCOUNTER — Ambulatory Visit: Payer: Self-pay

## 2021-06-03 NOTE — Telephone Encounter (Signed)
Spoke with a rep and gave her the requested information. Appeal will be in process once the info is computed she said.  ?

## 2021-06-03 NOTE — Telephone Encounter (Signed)
Haneen with [facility] called asking if the patient has been taking th Saxenda for 16 weeks and she needs his weight before taking the medication and weight currently.  Needs the date the weights were taken.  ? ?Fax [phone]  ? ?telephone [phone]  ? ? ?

## 2021-06-13 ENCOUNTER — Telehealth: Payer: Self-pay | Admitting: Family Medicine

## 2021-06-13 NOTE — Telephone Encounter (Signed)
Bunmi calling from [facility] is calling to request clinical information regarding SAXENDA 18 MG/3ML SOPN [[id number]]  ?Needing weights and dates prior to taking saxenda and the weights and dates after taking saxenda. A fax also to confirm this has been sent ?Fax- [phone] ?CB- [phone] ?

## 2021-06-18 NOTE — Telephone Encounter (Signed)
Dr. Markham Callas from [facility] called about the PA and stated that There are no progress notes and wanted the pts BMI and asked if he has tried other alternatives before like wegovy, etc... / please call for verbal  ?

## 2021-06-18 NOTE — Telephone Encounter (Signed)
Fax was sent on 3/31. I was notified of it today. I have filled out the requested information and have faxed it back to [facility].  ?

## 2021-07-01 ENCOUNTER — Encounter: Payer: Self-pay | Admitting: Family Medicine

## 2021-07-01 ENCOUNTER — Ambulatory Visit (INDEPENDENT_AMBULATORY_CARE_PROVIDER_SITE_OTHER): Payer: BC Managed Care – PPO | Admitting: Family Medicine

## 2021-07-01 VITALS — BP 132/86 | HR 91 | Ht 72.0 in | Wt >= 6400 oz

## 2021-07-01 DIAGNOSIS — Z6841 Body Mass Index (BMI) 40.0 and over, adult: Secondary | ICD-10-CM | POA: Diagnosis not present

## 2021-07-01 DIAGNOSIS — I10 Essential (primary) hypertension: Secondary | ICD-10-CM | POA: Diagnosis not present

## 2021-07-01 DIAGNOSIS — G8929 Other chronic pain: Secondary | ICD-10-CM

## 2021-07-01 DIAGNOSIS — M545 Low back pain, unspecified: Secondary | ICD-10-CM

## 2021-07-01 MED ORDER — SAXENDA 18 MG/3ML ~~LOC~~ SOPN: PEN_INJECTOR | SUBCUTANEOUS | 2 refills | Status: DC

## 2021-07-01 NOTE — Progress Notes (Signed)
? ?Subjective:  ? ? Patient ID: Nathan Flynn, male    DOB: 12/28/1985, 37 y.o.   MRN: [id number] ? ?Nathan Flynn is a 36 y.o. male presenting on 07/01/2021 for Obesity ? ? ?HPI ? ?Morbid Obesity BMI >67 ? ?Last visit for this issue 05/27/2021 ? ?He was only able to continue maintenance at [facility] 1.82m daily inj dose, due to side effects of nausea vomiting that were severe at higher doses 1.8, 2.4 and was unable to go to 3.028m The 1.2 is max dose he can tolerate ? ?Goal to improve lifestyle ?He has treadmill and weights at home for increase exercise ?At work he is either sedentary at desk, otherwise heavy lifting. ? ?Interval updates - he has done well on current dose with 5 lb wt loss in 1 month, but still working on improving wt management. ? ?Now today he reports SaKirke Shaggyas denied by insurance due to not meeting criteria for weight loss in 16 weeks. ? ?12/09/20 - 510 lbs 8 oz ?05/27/21 - 501 lbs ? ?Unable to lose 20 lbs or 4% body weight ? ?He would like a chance to try again. Has new insurance plan now. ? ?Now based on starting weight 496 lbs with goal wt loss 4% or 19 lbs. ? ? ?Of note, he has complications secondary to morbid obesity with Hypertension, Chronic Joint Pain. ? ? ?CHRONIC HTN: ?Significantly improved since restarting meds ?Current Meds - Amlodipine 1074maily, HCTZ 73m44mily ?Denies CP, dyspnea, HA, edema, dizziness / lightheadedness ? ? ? ? ?  07/01/2021  ?  8:41 AM 05/27/2021  ?  2:50 PM 10/01/2020  ?  8:29 AM  ?Depression screen PHQ 2/9  ?Decreased Interest 0 1 1  ?Down, Depressed, Hopeless 0 1 1  ?PHQ - 2 Score 0 2 2  ?Altered sleeping 0 0 0  ?Tired, decreased energy 0 3 3  ?Change in appetite 0 0 0  ?Feeling bad or failure about yourself  0 0 0  ?Trouble concentrating 0 0 0  ?Moving slowly or fidgety/restless 0 0 0  ?Suicidal thoughts 0 0 0  ?PHQ-9 Score 0 5 5  ?Difficult doing work/chores Not difficult at all Not difficult at all Not difficult at all  ? ? ?Social History   ? ?Tobacco Use  ? Smoking status: Never  ? Smokeless tobacco: Never  ?Vaping Use  ? Vaping Use: Never used  ?Substance Use Topics  ? Alcohol use: No  ? Drug use: No  ? ? ?Review of Systems ?Per HPI unless specifically indicated above ? ?   ?Objective:  ?  ?BP 132/86   Pulse 91   Ht 6' (1.829 m)   Wt (!) 496 lb (225 kg)   SpO2 99%   BMI 67.27 kg/m?   ?Wt Readings from Last 3 Encounters:  ?07/01/21 (!) 496 lb (225 kg)  ?05/27/21 (!) 501 lb (227.3 kg)  ?12/09/20 (!) 510 lb 8 oz (231.6 kg)  ?  ?Physical Exam ?Vitals and nursing note reviewed.  ?Constitutional:   ?   General: He is not in acute distress. ?   Appearance: He is well-developed. He is obese. He is not diaphoretic.  ?   Comments: Well-appearing, comfortable, cooperative  ?HENT:  ?   Head: Normocephalic and atraumatic.  ?Eyes:  ?   General:     ?   Right eye: No discharge.     ?   Left eye: No discharge.  ?   Conjunctiva/sclera: Conjunctivae normal.  ?  Neck:  ?   Thyroid: No thyromegaly.  ?Cardiovascular:  ?   Rate and Rhythm: Normal rate and regular rhythm.  ?   Pulses: Normal pulses.  ?   Heart sounds: Normal heart sounds. No murmur heard. ?Pulmonary:  ?   Effort: Pulmonary effort is normal. No respiratory distress.  ?   Breath sounds: Normal breath sounds. No wheezing or rales.  ?Musculoskeletal:     ?   General: Normal range of motion.  ?   Cervical back: Normal range of motion and neck supple.  ?Lymphadenopathy:  ?   Cervical: No cervical adenopathy.  ?Skin: ?   General: Skin is warm and dry.  ?   Findings: No erythema or rash.  ?Neurological:  ?   Mental Status: He is alert and oriented to person, place, and time. Mental status is at baseline.  ?Psychiatric:     ?   Behavior: Behavior normal.  ?   Comments: Well groomed, good eye contact, normal speech and thoughts  ? ? ? ?Results for orders placed or performed in visit on 12/11/20  ?TSH  ?Result Value Ref Range  ? TSH 1.97 0.40 - 4.50 mIU/L  ?Hemoglobin A1c  ?Result Value Ref Range  ? Hgb A1c MFr  Bld 5.6 <5.7 % of total Hgb  ? Mean Plasma Glucose 114 mg/dL  ? eAG (mmol/L) 6.3 mmol/L  ?Lipid panel  ?Result Value Ref Range  ? Cholesterol 169 <200 mg/dL  ? HDL 44 > OR = 40 mg/dL  ? Triglycerides 63 <150 mg/dL  ? LDL Cholesterol (Calc) 110 (H) mg/dL (calc)  ? Total CHOL/HDL Ratio 3.8 <5.0 (calc)  ? Non-HDL Cholesterol (Calc) 125 <130 mg/dL (calc)  ?COMPLETE METABOLIC PANEL WITH GFR  ?Result Value Ref Range  ? Glucose, Bld 102 (H) 65 - 99 mg/dL  ? BUN 19 7 - 25 mg/dL  ? Creat 0.82 0.60 - 1.26 mg/dL  ? eGFR 116 > OR = 60 mL/min/1.84m  ? BUN/Creatinine Ratio NOT APPLICABLE 6 - 22 (calc)  ? Sodium 140 135 - 146 mmol/L  ? Potassium 4.5 3.5 - 5.3 mmol/L  ? Chloride 101 98 - 110 mmol/L  ? CO2 29 20 - 32 mmol/L  ? Calcium 9.4 8.6 - 10.3 mg/dL  ? Total Protein 7.5 6.1 - 8.1 g/dL  ? Albumin 4.4 3.6 - 5.1 g/dL  ? Globulin 3.1 1.9 - 3.7 g/dL (calc)  ? AG Ratio 1.4 1.0 - 2.5 (calc)  ? Total Bilirubin 0.6 0.2 - 1.2 mg/dL  ? Alkaline phosphatase (APISO) 54 36 - 130 U/L  ? AST 20 10 - 40 U/L  ? ALT 29 9 - 46 U/L  ? ?   ?Assessment & Plan:  ? ?Problem List Items Addressed This Visit   ? ? Morbid obesity with BMI of 60.0-69.9, adult ([place]  ? Relevant Medications  ? SAXENDA 18 MG/3ML SOPN  ? Essential hypertension  ? Chronic midline low back pain without sciatica  ?  ?Morbid Obesity with complications, Hypertension, Chronic Back/Knee Joint pain. ?BMI >67 ?Unable to tolerate max dose Saxenda due to GI side effects intolerance ?Has improved on Saxenda 1.23mdaily inj maintenance. ?However unable to lose 4% weight in 16 weeks since started, he did reach goal but not weighed here, and he has gained some weight back. Still overall losing weight on medication. ? ?Discussion today with goal to restart trial of medicine again, he has new insurance plan and coverage now. ? ?Will attempt to re  order Saxenda start at 0.69m daily inj then inc up to 1.235mdaily injection to maintain, can adjust dose future as tolerated. Goal to  improve lifestyle interventions as well. ? ?Given his medical co morbid conditions and severity of his weight, we discussed that this therapy would be medically necessary and beneficial to him. ? ?Starting weight now 496 lbs today and goal wt loss 4% or 19 lbs. In 4 months. ? ?Meds ordered this encounter  ?Medications  ? SAXENDA 18 MG/3ML SOPN  ?  Sig: Injection 0.6 mg into skin once daily for 1 week, as tolerated increase up to 1.13m78maily (max tolerated dose)  ?  Dispense:  15 mL  ?  Refill:  2  ? ? ? ? ?Follow up plan: ?Return in about 4 months (around 10/31/2021) for 4 month follow-up Weight Management (goal 4% loss from prior, renew SaxK[place]? ? ?AleNobie PutnamO ?[facility] ?07/01/2021, 8:18 AM ?

## 2021-07-01 NOTE — Patient Instructions (Addendum)
Thank you for coming to the office today. ? ?Goal down 4% body weight in 16 weeks, current weight 496 lbs, goal weight 477 lbs by the 16 week visit. ? ?We have sent new order start back at 0.6 daily according to rx, you can continue your dose of 1.2 as long as you have supply, will try to get new authorization. ? ?Stay tuned for any updates. If you find out anything, let me know. ? ?Please schedule a Follow-up Appointment to: Return in about 4 months (around 10/31/2021) for 4 month follow-up Weight Management (goal 4% loss from prior, renew [place]). ? ?If you have any other questions or concerns, please feel free to call the office or send a message through MyChart. You may also schedule an earlier appointment if necessary. ? ?Additionally, you may be receiving a survey about your experience at our office within a few days to 1 week by e-mail or mail. We value your feedback. ? ?Saralyn Pilar, DO ?[facility] [place] ?

## 2021-09-26 ENCOUNTER — Telehealth: Payer: Self-pay | Admitting: *Deleted

## 2021-09-26 DIAGNOSIS — N200 Calculus of kidney: Secondary | ICD-10-CM

## 2021-09-26 NOTE — Telephone Encounter (Signed)
Left Vm to return call for confirmation-to get KUB done prior

## 2021-09-30 ENCOUNTER — Ambulatory Visit: Payer: Self-pay | Admitting: Urology

## 2021-11-25 ENCOUNTER — Other Ambulatory Visit: Payer: 59

## 2021-12-02 ENCOUNTER — Encounter: Payer: 59 | Admitting: Family Medicine

## 2021-12-22 ENCOUNTER — Other Ambulatory Visit: Payer: Self-pay

## 2021-12-22 DIAGNOSIS — R7309 Other abnormal glucose: Secondary | ICD-10-CM

## 2021-12-22 DIAGNOSIS — I10 Essential (primary) hypertension: Secondary | ICD-10-CM

## 2021-12-22 DIAGNOSIS — K760 Fatty (change of) liver, not elsewhere classified: Secondary | ICD-10-CM

## 2021-12-22 DIAGNOSIS — Z0000 Encounter for general adult medical examination without abnormal findings: Secondary | ICD-10-CM

## 2021-12-22 DIAGNOSIS — E7800 Pure hypercholesterolemia, unspecified: Secondary | ICD-10-CM

## 2021-12-23 ENCOUNTER — Other Ambulatory Visit: Payer: BC Managed Care – PPO

## 2021-12-23 DIAGNOSIS — K760 Fatty (change of) liver, not elsewhere classified: Secondary | ICD-10-CM | POA: Diagnosis not present

## 2021-12-23 DIAGNOSIS — R7309 Other abnormal glucose: Secondary | ICD-10-CM | POA: Diagnosis not present

## 2021-12-23 DIAGNOSIS — E7800 Pure hypercholesterolemia, unspecified: Secondary | ICD-10-CM | POA: Diagnosis not present

## 2021-12-23 DIAGNOSIS — I10 Essential (primary) hypertension: Secondary | ICD-10-CM | POA: Diagnosis not present

## 2021-12-24 LAB — PSA: PSA: 0.28 ng/mL (ref <=4.00)

## 2021-12-24 LAB — LIPID PANEL
Cholesterol: 179 mg/dL (ref <200)
HDL: 41 mg/dL (ref >=40)
LDL Cholesterol (Calc): 120 mg/dL (calc) — ABNORMAL HIGH
Non-HDL Cholesterol (Calc): 138 mg/dL (calc) — ABNORMAL HIGH (ref <130)
Total CHOL/HDL Ratio: 4.4 (calc) (ref <5.0)
Triglycerides: 81 mg/dL (ref <150)

## 2021-12-24 LAB — COMPLETE METABOLIC PANEL WITH GFR
AG Ratio: 1.5 (calc) (ref 1.0–2.5)
ALT: 25 U/L (ref 9–46)
AST: 21 U/L (ref 10–40)
Albumin: 4.4 g/dL (ref 3.6–5.1)
Alkaline phosphatase (APISO): 51 U/L (ref 36–130)
BUN: 15 mg/dL (ref 7–25)
CO2: 29 mmol/L (ref 20–32)
Calcium: 9.2 mg/dL (ref 8.6–10.3)
Chloride: 104 mmol/L (ref 98–110)
Creat: 0.85 mg/dL (ref 0.60–1.26)
Globulin: 3 g/dL (calc) (ref 1.9–3.7)
Glucose, Bld: 102 mg/dL — ABNORMAL HIGH (ref 65–99)
Potassium: 4.1 mmol/L (ref 3.5–5.3)
Sodium: 141 mmol/L (ref 135–146)
Total Bilirubin: 0.6 mg/dL (ref 0.2–1.2)
Total Protein: 7.4 g/dL (ref 6.1–8.1)
eGFR: 114 mL/min/{1.73_m2} (ref >=60)

## 2021-12-24 LAB — CBC WITH DIFFERENTIAL/PLATELET
Absolute Monocytes: 410 cells/uL (ref 200–950)
Basophils Absolute: 21 cells/uL (ref 0–200)
Basophils Relative: 0.5 %
Eosinophils Absolute: 217 cells/uL (ref 15–500)
Eosinophils Relative: 5.3 %
HCT: 42.4 % (ref 38.5–50.0)
Hemoglobin: 14.2 g/dL (ref 13.2–17.1)
Lymphs Abs: 1435 cells/uL (ref 850–3900)
MCH: 30.9 pg (ref 27.0–33.0)
MCHC: 33.5 g/dL (ref 32.0–36.0)
MCV: 92.2 fL (ref 80.0–100.0)
MPV: 11.8 fL (ref 7.5–12.5)
Monocytes Relative: 10 %
Neutro Abs: 2017 cells/uL (ref 1500–7800)
Neutrophils Relative %: 49.2 %
Platelets: 183 10*3/uL (ref 140–400)
RBC: 4.6 10*6/uL (ref 4.20–5.80)
RDW: 12.2 % (ref 11.0–15.0)
Total Lymphocyte: 35 %
WBC: 4.1 10*3/uL (ref 3.8–10.8)

## 2021-12-24 LAB — HEMOGLOBIN A1C
Hgb A1c MFr Bld: 5.6 % of total Hgb (ref <5.7)
Mean Plasma Glucose: 114 mg/dL
eAG (mmol/L): 6.3 mmol/L

## 2021-12-24 LAB — TSH: TSH: 1.73 mIU/L (ref 0.40–4.50)

## 2021-12-30 ENCOUNTER — Encounter: Payer: Self-pay | Admitting: Family Medicine

## 2021-12-30 ENCOUNTER — Telehealth: Payer: Self-pay | Admitting: Family Medicine

## 2021-12-30 ENCOUNTER — Ambulatory Visit (INDEPENDENT_AMBULATORY_CARE_PROVIDER_SITE_OTHER): Payer: 59 | Admitting: Family Medicine

## 2021-12-30 VITALS — BP 144/82 | HR 92 | Ht 72.0 in | Wt >= 6400 oz

## 2021-12-30 DIAGNOSIS — Z0000 Encounter for general adult medical examination without abnormal findings: Secondary | ICD-10-CM

## 2021-12-30 DIAGNOSIS — I10 Essential (primary) hypertension: Secondary | ICD-10-CM

## 2021-12-30 DIAGNOSIS — Z6841 Body Mass Index (BMI) 40.0 and over, adult: Secondary | ICD-10-CM

## 2021-12-30 MED ORDER — WEGOVY 1.7 MG/0.75ML ~~LOC~~ SOAJ: 1.7000 mg | SUBCUTANEOUS | 2 refills | Status: DC

## 2021-12-30 NOTE — Telephone Encounter (Signed)
Requested medication (s) are due for refill today: no  Requested medication (s) are on the active medication list: yes  Last refill:  today  Future visit scheduled: yes  Notes to clinic:  Pharmacy comment: Alternative Requested:NOT COVERED.     Requested Prescriptions  Pending Prescriptions Disp Refills   WEGOVY 1.7 MG/0.75ML SOAJ [Pharmacy Med Name: WEGOVY 1.7 MG/0.75 ML PEN]  2    Sig: Inject 1.7 mg into the skin once a week.     Endocrinology:  Diabetes - GLP-1 Receptor Agonists - semaglutide Passed - 12/30/2021  3:27 PM      Passed - HBA1C in normal range and within 180 days    Hgb A1c MFr Bld  Date Value Ref Range Status  12/23/2021 5.6 <5.7 % of total Hgb Final    Comment:    For the purpose of screening for the presence of diabetes: . <5.7%       Consistent with the absence of diabetes 5.7-6.4%    Consistent with increased risk for diabetes             (prediabetes) > or =6.5%  Consistent with diabetes . This assay result is consistent with a decreased risk of diabetes. . Currently, no consensus exists regarding use of hemoglobin A1c for diagnosis of diabetes in children. . According to American Diabetes Association (ADA) guidelines, hemoglobin A1c <7.0% represents optimal control in non-pregnant diabetic patients. Different metrics may apply to specific patient populations.  Standards of Medical Care in Diabetes(ADA). .          Passed - Cr in normal range and within 360 days    Creat  Date Value Ref Range Status  12/23/2021 0.85 0.60 - 1.26 mg/dL Final         Passed - Valid encounter within last 6 months    Recent Outpatient Visits           Today Annual physical exam   [place], DO   6 months ago Morbid obesity with BMI of 60.0-69.9, adult [facility])   [place], DO   7 months ago Morbid obesity with BMI of 60.0-69.9, adult [facility])   [place], DO   1 year ago Morbid obesity with BMI of 60.0-69.9, adult [facility])   [facility] Olin Hauser, DO   1 year ago Essential hypertension   [place], DO       Future Appointments             In 4 months [place], Devonne Doughty, DO [facility], [facility]

## 2021-12-30 NOTE — Progress Notes (Signed)
Subjective:    Patient ID: Nathan Flynn, male    DOB: 1985-09-28, 36 y.o.   MRN: [id number]  Nathan Flynn is a 36 y.o. 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 Continues Amlodipine 59m daily, HCTZ 248mdaily  March 2023 - weight 510 lbs., lost down to 480 lbs range on Saxenda. Last month now weight coming back up 495+ range with more sedentary due to additional job - He has continued Saxenda 1.21m121maily  Time barrier mainly limiting him   Of note, he has complications secondary to morbid obesity with Hypertension, Chronic Joint Pain.     CHRONIC HTN: Significantly improved since restarting meds Current Meds - Amlodipine 17m71mily, HCTZ 25mg121mly Denies CP, dyspnea, HA, edema, dizziness / lightheadedness  Health Maintenance: PSA 0.28 (negative) 12/2021     12/30/2021    2:31 PM 07/01/2021    8:41 AM 05/27/2021    2:50 PM  Depression screen PHQ 2/9  Decreased Interest 0 0 1  Down, Depressed, Hopeless 0 0 1  PHQ - 2 Score 0 0 2  Altered sleeping 0 0 0  Tired, decreased energy 1 0 3  Change in appetite 0 0 0  Feeling bad or failure about yourself  0 0 0  Trouble concentrating 0 0 0  Moving slowly or fidgety/restless 0 0 0  Suicidal thoughts 0 0 0  PHQ-9 Score 1 0 5  Difficult doing work/chores Not difficult at all Not difficult at all Not difficult at all    Past Medical History:  Diagnosis Date   History of kidney stones    Kidney stone    Past Surgical History:  Procedure Laterality Date   CYSTOSCOPY W/ URETERAL STENT PLACEMENT Left 06/01/2016   Procedure: CYSTOSCOPY WITH  STENT  REPLACMENT;  Surgeon: AshleHollice Espy  Location: [facility];  Service: Urology;  Laterality: Left;   CYSTOSCOPY WITH STENT PLACEMENT Left 05/25/2016   Procedure: CYSTOSCOPY WITH STENT PLACEMENT;  Surgeon: AshleHollice Espy  Location: [facility];  Service: Urology;  Laterality: Left;   CYSTOSCOPY/URETEROSCOPY/HOLMIUM LASER/STENT PLACEMENT Right 07/15/2020   Procedure: CYSTOSCOPY/URETEROSCOPY/STENT PLACEMENT;  Surgeon: BrandHollice Espy  Location: [facility];  Service: Urology;  Laterality: Right;   CYSTOSCOPY/URETEROSCOPY/HOLMIUM LASER/STENT PLACEMENT Right 07/22/2020   Procedure: CYSTOSCOPY/URETEROSCOPY/HOLMIUM LASER/STENT PLACEMENT;  Surgeon: BrandHollice Espy  Location: [facility];  Service: Urology;  Laterality: Right;   none     URETEROSCOPY Left 05/25/2016   Procedure: URETEROSCOPY;  Surgeon: AshleHollice Espy  Location: [facility];  Service: Urology;  Laterality: Left;   URETEROSCOPY WITH HOLMIUM LASER LITHOTRIPSY Left 06/01/2016   Procedure: URETEROSCOPY WITH HOLMIUM LASER LITHOTRIPSY;  Surgeon: AshleHollice Espy  Location: [facility];  Service: Urology;  Laterality: Left;   Social History   Socioeconomic History   Marital status: Married    Spouse name: Not on file   Number of children: Not on file   Years of education: Not on file   Highest education level: Not on file  Occupational History   Not on file  Tobacco Use   Smoking  status: Never   Smokeless tobacco: Never  Vaping Use   Vaping Use: Never used  Substance and Sexual Activity   Alcohol use: No   Drug use: No   Sexual activity: Not on file  Other Topics Concern   Not on file  Social History Narrative   Not on file   Social Determinants of Health   Financial Resource Strain: Not on file  Food Insecurity: Not on file  Transportation Needs: Not on file  Physical Activity: Not on file  Stress: Not on file  Social Connections: Not on file  Intimate Partner Violence: Not on file   Family History  Problem  Relation Age of Onset   Heart attack Father 67   Heart attack Paternal Aunt 29   Prostate cancer Neg Hx    Kidney cancer Neg Hx    Bladder Cancer Neg Hx    Current Outpatient Medications on File Prior to Visit  Medication Sig   amLODipine (NORVASC) 10 MG tablet Take 1 tablet (10 mg total) by mouth daily.   fluticasone (FLONASE) 50 MCG/ACT nasal spray Place 2 sprays into both nostrils daily. Use for 4-6 weeks then stop and use seasonally or as needed.   hydrochlorothiazide (HYDRODIURIL) 25 MG tablet Take 1 tablet (25 mg total) by mouth daily.   Insulin Pen Needle (UNIFINE PENTIPS) 31G X 8 MM MISC Inject Saxenda daily into skin.   nitroGLYCERIN (NITROSTAT) 0.4 MG SL tablet Place 1 tablet (0.4 mg total) under the tongue every 5 (five) minutes as needed for chest pain.   No current facility-administered medications on file prior to visit.    Review of Systems  Constitutional:  Negative for activity change, appetite change, chills, diaphoresis, fatigue and fever.  HENT:  Negative for congestion and hearing loss.   Eyes:  Negative for visual disturbance.  Respiratory:  Negative for cough, chest tightness, shortness of breath and wheezing.   Cardiovascular:  Negative for chest pain, palpitations and leg swelling.  Gastrointestinal:  Negative for abdominal pain, constipation, diarrhea, nausea and vomiting.  Genitourinary:  Negative for dysuria, frequency and hematuria.  Musculoskeletal:  Negative for arthralgias and neck pain.  Skin:  Negative for rash.  Neurological:  Negative for dizziness, weakness, light-headedness, numbness and headaches.  Hematological:  Negative for adenopathy.  Psychiatric/Behavioral:  Negative for behavioral problems, dysphoric mood and sleep disturbance.    Per HPI unless specifically indicated above      Objective:    BP (!) 144/82 (BP Location: Left Arm, Cuff Size: Large)   Pulse 92   Ht 6' (1.829 m)   Wt (!) 517 lb 3.2 oz (234.6 kg)   SpO2 99%   BMI  70.14 kg/m   Wt Readings from Last 3 Encounters:  12/30/21 (!) 517 lb 3.2 oz (234.6 kg)  07/01/21 (!) 496 lb (225 kg)  05/27/21 (!) 501 lb (227.3 kg)    Physical Exam Vitals and nursing note reviewed.  Constitutional:      General: He is not in acute distress.    Appearance: He is well-developed. He is obese. He is not diaphoretic.     Comments: Well-appearing, comfortable, cooperative  HENT:     Head: Normocephalic and atraumatic.  Eyes:     General:        Right eye: No discharge.        Left eye: No discharge.     Conjunctiva/sclera: Conjunctivae normal.     Pupils: Pupils are equal, round, and reactive to light.  Neck:  Thyroid: No thyromegaly.     Vascular: No carotid bruit.  Cardiovascular:     Rate and Rhythm: Normal rate and regular rhythm.     Pulses: Normal pulses.     Heart sounds: Normal heart sounds. No murmur heard. Pulmonary:     Effort: Pulmonary effort is normal. No respiratory distress.     Breath sounds: Normal breath sounds. No wheezing or rales.  Abdominal:     General: Bowel sounds are normal. There is no distension.     Palpations: Abdomen is soft. There is no mass.     Tenderness: There is no abdominal tenderness.  Musculoskeletal:        General: No tenderness. Normal range of motion.     Cervical back: Normal range of motion and neck supple.     Right lower leg: No edema.     Left lower leg: No edema.     Comments: Upper / Lower Extremities: - Normal muscle tone, strength bilateral upper extremities 5/5, lower extremities 5/5  Lymphadenopathy:     Cervical: No cervical adenopathy.  Skin:    General: Skin is warm and dry.     Findings: No erythema or rash.  Neurological:     Mental Status: He is alert and oriented to person, place, and time.     Comments: Distal sensation intact to light touch all extremities  Psychiatric:        Mood and Affect: Mood normal.        Behavior: Behavior normal.        Thought Content: Thought content  normal.     Comments: Well groomed, good eye contact, normal speech and thoughts      Results for orders placed or performed in visit on 12/22/21  TSH  Result Value Ref Range   TSH 1.73 0.40 - 4.50 mIU/L  PSA  Result Value Ref Range   PSA 0.28 < OR = 4.00 ng/mL  Hemoglobin A1c  Result Value Ref Range   Hgb A1c MFr Bld 5.6 <5.7 % of total Hgb   Mean Plasma Glucose 114 mg/dL   eAG (mmol/L) 6.3 mmol/L  Lipid panel  Result Value Ref Range   Cholesterol 179 <200 mg/dL   HDL 41 > OR = 40 mg/dL   Triglycerides 81 <150 mg/dL   LDL Cholesterol (Calc) 120 (H) mg/dL (calc)   Total CHOL/HDL Ratio 4.4 <5.0 (calc)   Non-HDL Cholesterol (Calc) 138 (H) <130 mg/dL (calc)  CBC with Differential/Platelet  Result Value Ref Range   WBC 4.1 3.8 - 10.8 Thousand/uL   RBC 4.60 4.20 - 5.80 Million/uL   Hemoglobin 14.2 13.2 - 17.1 g/dL   HCT 42.4 38.5 - 50.0 %   MCV 92.2 80.0 - 100.0 fL   MCH 30.9 27.0 - 33.0 pg   MCHC 33.5 32.0 - 36.0 g/dL   RDW 12.2 11.0 - 15.0 %   Platelets 183 140 - 400 Thousand/uL   MPV 11.8 7.5 - 12.5 fL   Neutro Abs 2,017 1,500 - 7,800 cells/uL   Lymphs Abs 1,435 850 - 3,900 cells/uL   Absolute Monocytes 410 200 - 950 cells/uL   Eosinophils Absolute 217 15 - 500 cells/uL   Basophils Absolute 21 0 - 200 cells/uL   Neutrophils Relative % 49.2 %   Total Lymphocyte 35.0 %   Monocytes Relative 10.0 %   Eosinophils Relative 5.3 %   Basophils Relative 0.5 %  COMPLETE METABOLIC PANEL WITH GFR  Result Value Ref Range  Glucose, Bld 102 (H) 65 - 99 mg/dL   BUN 15 7 - 25 mg/dL   Creat 0.85 0.60 - 1.26 mg/dL   eGFR 114 > OR = 60 mL/min/1.59m   BUN/Creatinine Ratio SEE NOTE: 6 - 22 (calc)   Sodium 141 135 - 146 mmol/L   Potassium 4.1 3.5 - 5.3 mmol/L   Chloride 104 98 - 110 mmol/L   CO2 29 20 - 32 mmol/L   Calcium 9.2 8.6 - 10.3 mg/dL   Total Protein 7.4 6.1 - 8.1 g/dL   Albumin 4.4 3.6 - 5.1 g/dL   Globulin 3.0 1.9 - 3.7 g/dL (calc)   AG Ratio 1.5 1.0 - 2.5 (calc)    Total Bilirubin 0.6 0.2 - 1.2 mg/dL   Alkaline phosphatase (APISO) 51 36 - 130 U/L   AST 21 10 - 40 U/L   ALT 25 9 - 46 U/L      Assessment & Plan:   Problem List Items Addressed This Visit     Essential hypertension   Morbid obesity with BMI of 70 and over, adult ([place]   Relevant Medications   WEGOVY 1.7 MG/0.75ML SOAJ   Other Visit Diagnoses     Annual physical exam    -  Primary       Updated Health Maintenance information Reviewed recent lab results with patient Mild elevated LDL 120 range, slight increase, overall still within range but gradual inc trend A1c 5.6 stable from previous, normal but at risk of progression to pre diabetes in future.  Encouraged improvement to lifestyle with diet and exercise  Weight Management discussion today Limited success on Saxenda max dose 1.2 tolerated, he lost 30+ lbs then gained back decent amount with more sedentary time with 2nd job now. Not candidate for Phentermine Consider Contrave Will trial Wegovy 1.776mweekly inj - new rx sent. Will require PA. Can do ozempic off label use sample x 1 to help bridge only if it gets approved. If unsuccessful, can use contrave sample trial and order the [place] for monthly supply $99 after cost savings.  Discussion today about app based / health coach options - reviewed list and looked online, recommend structured program to accompany medication.  Additionally, discussed future risks of obesity and recommend if ultimately we are unsuccessful with medications and lifestyle, then we are looking at Bariatric Referral and consult for surgical options.    Meds ordered this encounter  Medications   WEGOVY 1.7 MG/0.75ML SOAJ    Sig: Inject 1.7 mg into the skin once a week.    Dispense:  3 mL    Refill:  2    Follow up plan: Return in about 4 months (around 05/02/2022) for 4 month follow-up weight management + HTN.  AlNobie PutnamDOMidd[facility]own[facility] 12/30/2021, 3:27 PM

## 2021-12-30 NOTE — Patient Instructions (Addendum)
Thank you for coming to the office today.  JOACZY 1.7mg  weekly ordered, we will try to check coverage and cost and stock - problem is if covered we could still find an out of stock issue. This dose is more available. - if we get it approved we can do a sample Ozempic pen here that starts at 0.25 then up to 0.5mg  weekly, lower dose to gradually increase you. Let me know if need sample. (Ozempic is same med but for diabetes only)  Savings Card = google Wegovy savings card copay discount.  Contrave sample just to try if interested. 1 week sample, 7 pills, once daily, it is about $99 a month to continue.  Call insurance find cost and coverage of the following - check the following: - Drug Tier, Preferred List, On Formulary - All will require a "Prior Authorization" from [place] first, before you can find out the cost - Find out if there is "Step Therapy" (other medicines required before you can try these)  Once you pick the one you want to try, let me know - we can get a sample ready IF we have it in stock. Then try it - and before running out of medicine, contact me back to order your Rx so we have time to get it processed.  For Weight Loss / Obesity only  Wegovy (same as Ozempic) weekly injection - start 0.25mg  weekly, 1 dose per pen, single use, auto-injector  2. Saxenda - DAILY injection - start 0.6mg  injection DAILY, you can increase the dose by 1 notch or 0.6 mg per week, if you don't tolerate a dose, can reduce it the next day.  3. Contrave - oral medication, appetite suppression has wellbutrin/bupropion and naltrexone in it and it can also help with appetite, it is ordered through a speciality pharmacy.   Future make sure your insurance has weight loss coverage  WEIGHT MANAGEMENT  Dr Dennard Nip  [facility] [place], [place] [address] Ph: [phone]  -------------------------------------- Check into self paced / guided structured  weight loss / lifestyle management programs - mostly app or online / virtual based, can link up with health coach. Nutrition / Lifestyle focused.  [place]  Weight Watchers  Facebook Group $300 lifetime membership, invite to group, can work at own pace, videos, recording, meals shipped "[employer], [employer] attorney [employer], out of [place]"   Future reconsider Bariatric Surgery consultation.  Please schedule a Follow-up Appointment to: Return in about 4 months (around 05/02/2022) for 4 month follow-up weight management + HTN.  If you have any other questions or concerns, please feel free to call the office or send a message through [place]. You may also schedule an earlier appointment if necessary.  Additionally, you may be receiving a survey about your experience at our office within a few days to 1 week by e-mail or mail. We value your feedback.  Nobie Putnam, DO [place]

## 2021-12-31 NOTE — Telephone Encounter (Signed)
Called patient.  Wegovy not covered by insurance.  Will switch to Contrave oral  He has sample 7 pills, start nightly for 2 nights then twice a day for 2-3 days.  He will message me on mychart when ready for order to pharmacy [facility] mail order  He will hold saxenda 1.2 for now and resume if unsuccessful on [place], [facility] 12/31/2021, 12:35 PM

## 2022-01-05 MED ORDER — CONTRAVE 8-90 MG PO TB12: ORAL_TABLET | ORAL | 0 refills | Status: DC

## 2022-01-05 NOTE — Telephone Encounter (Signed)
Called patient back. He says no negatives or positives on Contrave early treatment first 1 week sample. He agrees to trial rx for 30 days, rx sent to [place]  (Note initially [facility] but I have called them to cancel it and re-sent it to [facility])  Hold Saxenda 1.2 mg inj daily for now, we can resume in future if Contrave unsuccessful after first 30 days.  Nobie Putnam, [facility] 01/05/2022, 3:06 PM

## 2022-01-05 NOTE — Addendum Note (Signed)
Addended by: Olin Hauser on: 01/05/2022 03:09 PM   Modules accepted: Orders

## 2022-01-05 NOTE — Telephone Encounter (Signed)
Pt called to report that there are no side effects and no noticeable changes thus far.

## 2022-02-03 ENCOUNTER — Other Ambulatory Visit: Payer: Self-pay

## 2022-02-03 MED ORDER — CONTRAVE 8-90 MG PO TB12: ORAL_TABLET | ORAL | 0 refills | Status: DC

## 2022-05-05 ENCOUNTER — Ambulatory Visit: Payer: BC Managed Care – PPO | Admitting: Family Medicine

## 2022-05-05 ENCOUNTER — Encounter: Payer: Self-pay | Admitting: Family Medicine

## 2022-05-05 VITALS — BP 135/80 | HR 99 | Temp 99.4°F | Ht 72.0 in | Wt >= 6400 oz

## 2022-05-05 DIAGNOSIS — Z6841 Body Mass Index (BMI) 40.0 and over, adult: Secondary | ICD-10-CM | POA: Diagnosis not present

## 2022-05-05 DIAGNOSIS — H6992 Unspecified Eustachian tube disorder, left ear: Secondary | ICD-10-CM

## 2022-05-05 MED ORDER — FLUTICASONE PROPIONATE 50 MCG/ACT NA SUSP: 2.0000 | Freq: Every day | NASAL | 3 refills | Status: DC

## 2022-05-05 NOTE — Patient Instructions (Addendum)
Thank you for coming to the office today.  Double check with your insurance on the "Weight Loss Coverage" portion of your plan. If you don't have access to this now, maybe that does explain why some of the injection therapy options are not available or not covered.  Alternatively, may need other plan in future.  Also check into Bariatric Surgery approach as well.  Keep on Contrave for now.  Let me know if questions or need further help.  --------------  Some swelling L side tonsil and fluid behind L ear, likely sinus inflammation/ eustachian tube dysfunction.  Start nasal steroid Flonase 2 sprays in each nostril daily for 4-6 weeks, may repeat course seasonally or as needed  Ibuprofen 231m per can take 2-3 per dose up to 3 times per day with meal. For 1-2 weeks.   Please schedule a Follow-up Appointment to: Return in about 4 months (around 09/03/2022) for 4 month follow-up Weight management.  If you have any other questions or concerns, please feel free to call the office or send a message through [place] You may also schedule an earlier appointment if necessary.  Additionally, you may be receiving a survey about your experience at our office within a few days to 1 week by e-mail or mail. We value your feedback.  ANobie Putnam DO [place]

## 2022-05-05 NOTE — Progress Notes (Signed)
Subjective:    Patient ID: Nathan Flynn, male    DOB: 02-08-1986, 37 y.o.   MRN: [id number]  Nathan Flynn is a 37 y.o. male presenting on 05/05/2022 for Weight Check   HPI   Morbid Obesity BMI >71  Last visit 12/30/21 since that visit   He has continued on Contrave regularly. He admits problem with work balance shifted, he is spending twice as much time on computer sedentary. He is limited with time due to work and family and he has less time to be active. He has issue with his work season also having less activity for physical work, until Summer. - He is spending more time also on his computer for secondary work as well -Now past 1 week, significant improvement. He is working better with his wife on lifestyle, as she is working hard to lose weight as well. They are trying to purchase better foods/groceries.  Previously he was on [place] injections  He continues Contrave 2 pill twice a day maintenance, but has had some limited success with weight loss. Had missed occasional dose due to cost of medication.  [employer] app, tracking meals, and steps  A1c 5.6 (12/2021) previously 5.6   Elevated LDL from 110 to 120   Taking BP med, rarely missing, has missed 1-2 doses on occasion but not often. Continues Amlodipine 77m daily, HCTZ 269mdaily   March 2023 - weight 510 lbs., lost down to 480 lbs range on Saxenda. Last month now weight coming back up 495+ range with more sedentary due to additional job - He has continued Saxenda 1.74m67maily   Time barrier mainly limiting him   Of note, he has complications secondary to morbid obesity with Hypertension, Chronic Joint Pain.      Swallow sore throat. Feels pain radiate to inner ears. Tonsils feel swollen. Not issue with feeling sick. No fever chills.      05/05/2022    8:59 AM 12/30/2021    2:31 PM 07/01/2021    8:41 AM  Depression screen PHQ 2/9  Decreased Interest 0 0 0  Down,  Depressed, Hopeless 0 0 0  PHQ - 2 Score 0 0 0  Altered sleeping 0 0 0  Tired, decreased energy 1 1 0  Change in appetite 0 0 0  Feeling bad or failure about yourself  0 0 0  Trouble concentrating 0 0 0  Moving slowly or fidgety/restless 0 0 0  Suicidal thoughts 0 0 0  PHQ-9 Score 1 1 0  Difficult doing work/chores Not difficult at all Not difficult at all Not difficult at all    Social History   Tobacco Use   Smoking status: Never   Smokeless tobacco: Never  Vaping Use   Vaping Use: Never used  Substance Use Topics   Alcohol use: No   Drug use: No    Review of Systems Per HPI unless specifically indicated above     Objective:    BP 135/80   Pulse 99   Temp 99.4 F (37.4 C) (Oral)   Wt (!) 525 lb 8 oz (238.4 kg)   SpO2 98%   BMI 71.27 kg/m   Wt Readings from Last 3 Encounters:  05/05/22 (!) 525 lb 8 oz (238.4 kg)  12/30/21 (!) 517 lb 3.2 oz (234.6 kg)  07/01/21 (!) 496 lb (225 kg)    Physical Exam Vitals and nursing note reviewed.  Constitutional:      General: He is not  in acute distress.    Appearance: He is well-developed. He is obese. He is not diaphoretic.     Comments: Well-appearing, comfortable, cooperative  HENT:     Head: Normocephalic and atraumatic.     Right Ear: Tympanic membrane, ear canal and external ear normal. There is no impacted cerumen.     Left Ear: Tympanic membrane, ear canal and external ear normal. There is no impacted cerumen.     Nose: No congestion.     Mouth/Throat:     Mouth: Mucous membranes are moist.     Pharynx: Oropharynx is clear. No oropharyngeal exudate or posterior oropharyngeal erythema.  Eyes:     General:        Right eye: No discharge.        Left eye: No discharge.     Conjunctiva/sclera: Conjunctivae normal.  Neck:     Thyroid: No thyromegaly.  Cardiovascular:     Rate and Rhythm: Normal rate and regular rhythm.     Pulses: Normal pulses.     Heart sounds: Normal heart sounds. No murmur  heard. Pulmonary:     Effort: Pulmonary effort is normal. No respiratory distress.     Breath sounds: Normal breath sounds. No wheezing or rales.  Musculoskeletal:        General: Normal range of motion.     Cervical back: Normal range of motion and neck supple.  Lymphadenopathy:     Cervical: No cervical adenopathy.  Skin:    General: Skin is warm and dry.     Findings: No erythema or rash.  Neurological:     Mental Status: He is alert and oriented to person, place, and time. Mental status is at baseline.  Psychiatric:        Behavior: Behavior normal.     Comments: Well groomed, good eye contact, normal speech and thoughts       Results for orders placed or performed in visit on 12/22/21  TSH  Result Value Ref Range   TSH 1.73 0.40 - 4.50 mIU/L  PSA  Result Value Ref Range   PSA 0.28 < OR = 4.00 ng/mL  Hemoglobin A1c  Result Value Ref Range   Hgb A1c MFr Bld 5.6 <5.7 % of total Hgb   Mean Plasma Glucose 114 mg/dL   eAG (mmol/L) 6.3 mmol/L  Lipid panel  Result Value Ref Range   Cholesterol 179 <200 mg/dL   HDL 41 > OR = 40 mg/dL   Triglycerides 81 <150 mg/dL   LDL Cholesterol (Calc) 120 (H) mg/dL (calc)   Total CHOL/HDL Ratio 4.4 <5.0 (calc)   Non-HDL Cholesterol (Calc) 138 (H) <130 mg/dL (calc)  CBC with Differential/Platelet  Result Value Ref Range   WBC 4.1 3.8 - 10.8 Thousand/uL   RBC 4.60 4.20 - 5.80 Million/uL   Hemoglobin 14.2 13.2 - 17.1 g/dL   HCT 42.4 38.5 - 50.0 %   MCV 92.2 80.0 - 100.0 fL   MCH 30.9 27.0 - 33.0 pg   MCHC 33.5 32.0 - 36.0 g/dL   RDW 12.2 11.0 - 15.0 %   Platelets 183 140 - 400 Thousand/uL   MPV 11.8 7.5 - 12.5 fL   Neutro Abs 2,017 1,500 - 7,800 cells/uL   Lymphs Abs 1,435 850 - 3,900 cells/uL   Absolute Monocytes 410 200 - 950 cells/uL   Eosinophils Absolute 217 15 - 500 cells/uL   Basophils Absolute 21 0 - 200 cells/uL   Neutrophils Relative % 49.2 %   Total  Lymphocyte 35.0 %   Monocytes Relative 10.0 %   Eosinophils  Relative 5.3 %   Basophils Relative 0.5 %  COMPLETE METABOLIC PANEL WITH GFR  Result Value Ref Range   Glucose, Bld 102 (H) 65 - 99 mg/dL   BUN 15 7 - 25 mg/dL   Creat 0.85 0.60 - 1.26 mg/dL   eGFR 114 > OR = 60 mL/min/1.35m   BUN/Creatinine Ratio SEE NOTE: 6 - 22 (calc)   Sodium 141 135 - 146 mmol/L   Potassium 4.1 3.5 - 5.3 mmol/L   Chloride 104 98 - 110 mmol/L   CO2 29 20 - 32 mmol/L   Calcium 9.2 8.6 - 10.3 mg/dL   Total Protein 7.4 6.1 - 8.1 g/dL   Albumin 4.4 3.6 - 5.1 g/dL   Globulin 3.0 1.9 - 3.7 g/dL (calc)   AG Ratio 1.5 1.0 - 2.5 (calc)   Total Bilirubin 0.6 0.2 - 1.2 mg/dL   Alkaline phosphatase (APISO) 51 36 - 130 U/L   AST 21 10 - 40 U/L   ALT 25 9 - 46 U/L      Assessment & Plan:   Problem List Items Addressed This Visit     Morbid obesity with BMI of 70 and over, adult ([place]   Other Visit Diagnoses     Eustachian tube dysfunction, left       Relevant Medications   fluticasone (FLONASE) 50 MCG/ACT nasal spray       Morbid Obesity BMI Weight Management Double check with your insurance on the "Weight Loss Coverage" portion of your plan. If you don't have access to this now, maybe that does explain why some of the injection therapy options are not available or not covered.  Alternatively, may need other plan in future.  Also check into Bariatric Surgery approach as well.  Keep on Contrave for now. Sample if need in interval   --------------  Some swelling L side tonsil and fluid behind L ear, likely sinus inflammation/ eustachian tube dysfunction.  Start nasal steroid Flonase 2 sprays in each nostril daily for 4-6 weeks, may repeat course seasonally or as needed  Ibuprofen 2025mper can take 2-3 per dose up to 3 times per day with meal. For 1-2 weeks.   Meds ordered this encounter  Medications   fluticasone (FLONASE) 50 MCG/ACT nasal spray    Sig: Place 2 sprays into both nostrils daily. Use for 4-6 weeks then stop and use seasonally  or as needed.    Dispense:  16 g    Refill:  3    Follow up plan: Return in about 4 months (around 09/03/2022) for 4 month follow-up Weight management.   [facility] 05/05/2022, 8:57 AM

## 2022-06-07 ENCOUNTER — Other Ambulatory Visit: Payer: Self-pay | Admitting: Family Medicine

## 2022-06-07 DIAGNOSIS — I10 Essential (primary) hypertension: Secondary | ICD-10-CM

## 2022-06-08 NOTE — Telephone Encounter (Signed)
Requested Prescriptions  Pending Prescriptions Disp Refills   amLODipine (NORVASC) 10 MG tablet [Pharmacy Med Name: AMLODIPINE BESYLATE 10 MG TAB] 90 tablet 1    Sig: TAKE 1 TABLET BY MOUTH EVERY DAY     Cardiovascular: Calcium Channel Blockers 2 Passed - 06/07/2022  8:57 AM      Passed - Last BP in normal range    BP Readings from Last 1 Encounters:  05/05/22 135/80         Passed - Last Heart Rate in normal range    Pulse Readings from Last 1 Encounters:  05/05/22 99         Passed - Valid encounter within last 6 months    Recent Outpatient Visits           1 month ago Morbid obesity with BMI of 70 and over, adult [facility])   [facility] [place], Devonne Doughty, DO   5 months ago Annual physical exam   [facility] Olin Hauser, DO   11 months ago Morbid obesity with BMI of 60.0-69.9, adult [facility])   [place], DO   1 year ago Morbid obesity with BMI of 60.0-69.9, adult [facility])   [place], DO   1 year ago Morbid obesity with BMI of 60.0-69.9, adult [facility])   [facility] [place], Devonne Doughty, [place]

## 2022-06-25 ENCOUNTER — Other Ambulatory Visit: Payer: Self-pay

## 2022-06-26 MED ORDER — CONTRAVE 8-90 MG PO TB12: ORAL_TABLET | ORAL | 0 refills | Status: DC

## 2022-10-28 ENCOUNTER — Ambulatory Visit: Payer: BC Managed Care – PPO | Admitting: Family Medicine

## 2022-10-28 ENCOUNTER — Other Ambulatory Visit: Payer: Self-pay | Admitting: Family Medicine

## 2022-10-28 ENCOUNTER — Encounter: Payer: Self-pay | Admitting: Family Medicine

## 2022-10-28 VITALS — BP 138/88 | HR 87 | Temp 96.9°F | Ht 72.0 in | Wt >= 6400 oz

## 2022-10-28 DIAGNOSIS — L987 Excessive and redundant skin and subcutaneous tissue: Secondary | ICD-10-CM

## 2022-10-28 DIAGNOSIS — Z6841 Body Mass Index (BMI) 40.0 and over, adult: Secondary | ICD-10-CM | POA: Diagnosis not present

## 2022-10-28 MED ORDER — ZEPBOUND 2.5 MG/0.5ML ~~LOC~~ SOAJ: 2.5000 mg | SUBCUTANEOUS | 0 refills | Status: DC

## 2022-10-28 NOTE — Progress Notes (Unsigned)
Subjective:    Patient ID: Nathan Flynn, male    DOB: May 16, 1985, 37 y.o.   MRN: [id number]  Nathan Flynn is a 37 y.o. male presenting on 10/28/2022 for Referral (Pt would like to discuss options for extra skin on bilateral legs)   HPI  Morbid Obesity BMI >70  Weight loss near 500 lbs, lost 10-15 lbs down then some fluctuation, now net loss 6-7 wt loss goal to get more physical activity fitness, and limit his sedentary habits, limited by work that is sedentary  Interested in new options for wt loss   Since prior visit, remained on Contrave with limited success.  Previously he was on Saxenda (failed due to side effect intolerance) and unable to obtain [facility] injections   A1c 5.6 (12/2021) previously 5.6   Elevated LDL from 110 to 120      Of note, he has complications secondary to morbid obesity with Hypertension, Chronic Joint Pain.       10/28/2022    3:21 PM 05/05/2022    8:59 AM 12/30/2021    2:31 PM  Depression screen PHQ 2/9  Decreased Interest 0 0 0  Down, Depressed, Hopeless 0 0 0  PHQ - 2 Score 0 0 0  Altered sleeping 0 0 0  Tired, decreased energy 1 1 1   Change in appetite 0 0 0  Feeling bad or failure about yourself  0 0 0  Trouble concentrating 0 0 0  Moving slowly or fidgety/restless 0 0 0  Suicidal thoughts 0 0 0  PHQ-9 Score 1 1 1   Difficult doing work/chores Not difficult at all Not difficult at all Not difficult at all    Social History   Tobacco Use   Smoking status: Never   Smokeless tobacco: Never  Vaping Use   Vaping status: Never Used  Substance Use Topics   Alcohol use: No   Drug use: No    Review of Systems Per HPI unless specifically indicated above     Objective:    BP 138/88 (BP Location: Left Arm, Cuff Size: Large)   Pulse 87   Temp (!) 96.9 F (36.1 C) (Oral)   Ht 6' (1.829 m)   Wt (!) 519 lb (235.4 kg)   SpO2 97%   BMI 70.39 kg/m   Wt Readings from Last 3 Encounters:  10/28/22 (!) 519 lb  (235.4 kg)  05/05/22 (!) 525 lb 8 oz (238.4 kg)  12/30/21 (!) 517 lb 3.2 oz (234.6 kg)    Physical Exam Vitals and nursing note reviewed.  Constitutional:      General: He is not in acute distress.    Appearance: He is well-developed. He is obese. He is not diaphoretic.     Comments: Well-appearing, comfortable, cooperative  HENT:     Head: Normocephalic and atraumatic.  Eyes:     General:        Right eye: No discharge.        Left eye: No discharge.     Conjunctiva/sclera: Conjunctivae normal.  Neck:     Thyroid: No thyromegaly.  Cardiovascular:     Rate and Rhythm: Normal rate and regular rhythm.     Pulses: Normal pulses.     Heart sounds: Normal heart sounds. No murmur heard. Pulmonary:     Effort: Pulmonary effort is normal. No respiratory distress.     Breath sounds: Normal breath sounds. No wheezing or rales.  Musculoskeletal:        General: Normal range  of motion.     Cervical back: Normal range of motion and neck supple.  Lymphadenopathy:     Cervical: No cervical adenopathy.  Skin:    General: Skin is warm and dry.     Findings: No erythema or rash.     Comments: Large excess skin folds with dense tissue medial thighs extending below knees  Neurological:     Mental Status: He is alert and oriented to person, place, and time. Mental status is at baseline.  Psychiatric:        Behavior: Behavior normal.     Comments: Well groomed, good eye contact, normal speech and thoughts    Results for orders placed or performed in visit on 12/22/21  TSH  Result Value Ref Range   TSH 1.73 0.40 - 4.50 mIU/L  PSA  Result Value Ref Range   PSA 0.28 < OR = 4.00 ng/mL  Hemoglobin A1c  Result Value Ref Range   Hgb A1c MFr Bld 5.6 <5.7 % of total Hgb   Mean Plasma Glucose 114 mg/dL   eAG (mmol/L) 6.3 mmol/L  Lipid panel  Result Value Ref Range   Cholesterol 179 <200 mg/dL   HDL 41 > OR = 40 mg/dL   Triglycerides 81 <213 mg/dL   LDL Cholesterol (Calc) 120 (H) mg/dL  (calc)   Total CHOL/HDL Ratio 4.4 <5.0 (calc)   Non-HDL Cholesterol (Calc) 138 (H) <130 mg/dL (calc)  CBC with Differential/Platelet  Result Value Ref Range   WBC 4.1 3.8 - 10.8 Thousand/uL   RBC 4.60 4.20 - 5.80 Million/uL   Hemoglobin 14.2 13.2 - 17.1 g/dL   HCT 08.6 57.8 - 46.9 %   MCV 92.2 80.0 - 100.0 fL   MCH 30.9 27.0 - 33.0 pg   MCHC 33.5 32.0 - 36.0 g/dL   RDW 62.9 52.8 - 41.3 %   Platelets 183 140 - 400 Thousand/uL   MPV 11.8 7.5 - 12.5 fL   Neutro Abs 2,017 1,500 - 7,800 cells/uL   Lymphs Abs 1,435 850 - 3,900 cells/uL   Absolute Monocytes 410 200 - 950 cells/uL   Eosinophils Absolute 217 15 - 500 cells/uL   Basophils Absolute 21 0 - 200 cells/uL   Neutrophils Relative % 49.2 %   Total Lymphocyte 35.0 %   Monocytes Relative 10.0 %   Eosinophils Relative 5.3 %   Basophils Relative 0.5 %  COMPLETE METABOLIC PANEL WITH GFR  Result Value Ref Range   Glucose, Bld 102 (H) 65 - 99 mg/dL   BUN 15 7 - 25 mg/dL   Creat 2.44 0.10 - 2.72 mg/dL   eGFR 536 > OR = 60 UY/QIH/4.74Q5   BUN/Creatinine Ratio SEE NOTE: 6 - 22 (calc)   Sodium 141 135 - 146 mmol/L   Potassium 4.1 3.5 - 5.3 mmol/L   Chloride 104 98 - 110 mmol/L   CO2 29 20 - 32 mmol/L   Calcium 9.2 8.6 - 10.3 mg/dL   Total Protein 7.4 6.1 - 8.1 g/dL   Albumin 4.4 3.6 - 5.1 g/dL   Globulin 3.0 1.9 - 3.7 g/dL (calc)   AG Ratio 1.5 1.0 - 2.5 (calc)   Total Bilirubin 0.6 0.2 - 1.2 mg/dL   Alkaline phosphatase (APISO) 51 36 - 130 U/L   AST 21 10 - 40 U/L   ALT 25 9 - 46 U/L      Assessment & Plan:   Problem List Items Addressed This Visit     Morbid obesity with BMI  of 70 and over, adult [facility]) - Primary   Relevant Medications   ZEPBOUND 2.5 MG/0.5ML Pen   Other Relevant Orders   Ambulatory referral to Plastic Surgery   Other Visit Diagnoses     Excess skin of thigh       Relevant Orders   Ambulatory referral to Plastic Surgery       Severe Morbid Obesity BMI >70 Past trial on Saxenda (failed, side  effects), Contrave ineffective, [facility] unable to obtain Lifestyle management has not been successful, limited by sedentary work  [employer]  New rx Zepbound 2.5mg  weekly inj for weight loss, it is on formulary but not preferred, and higher tier, will pursue PA and patient may utilize company copay discount card if approved  Will dose titrate monthly  Referral to Plastic Surgery for consultation on bilateral lower extremity thighs with excess skin and fat tissue due to morbid obesity BMI >70, excess tissue is heavy and impeding his ability to ambulate and function. He has been started on medical management for weight loss with Zepbound. And improving diet lifestyle but would like to have surgical consultation to address these areas that may improve his mobility and allow him to lose more weight.  Orders Placed This Encounter  Procedures   Ambulatory referral to Plastic Surgery    Referral Priority:   Routine    Referral Type:   Surgical    Referral Reason:   Specialty Services Required    Requested Specialty:   Plastic Surgery    Number of Visits Requested:   1     Meds ordered this encounter  Medications   ZEPBOUND 2.5 MG/0.5ML Pen    Sig: Inject 2.5 mg into the skin once a week.    Dispense:  2 mL    Refill:  0      Follow up plan: Return in about 3 months (around 01/28/2023) for 3 month fasting lab only then 1 week later Annual Physical.  Future labs ordered for 01/20/23   Saralyn Pilar, DO [facility] 10/28/2022, 3:37 PM

## 2022-10-28 NOTE — Patient Instructions (Addendum)
Thank you for coming to the office today.   Referral to Plastic Surgery  Foster Simpson, DO Specialties and/or Subspecialties Plastic and Reconstructive Surgery Patient Rating 4.7/5 (Based on 214 Reviews) [facility] [place], [place] Main: [phone] [facility], [place]  Stay tuned for updates / scheduling apts  They may do initial consult and discuss surgical options.  For now we will also simultaneously pursue the weight loss medication with Zepbound  2.5mg  weekly inj, we will work on approval and once it is approved you can submit / apply for the company copay card discount coupon.  Alternative options  Semaglutide injection (mixed Ozempic) from [facility] 0.25mg  weekly for 4 weeks then increase to 0.5mg  weekly It comes in a vial and a needle syringe, you need to draw up the shot and self admin it weekly Cost is about $200 per month Call them to check pricing and availability  [facility] Address: [address] [place], [place] [address] Phone: [phone]  --------------------------------  Doctors office in [place], does not accept insurance. Call to schedule / Walk in to schedule They can do the weekly weight loss injections (same as Ozempic) Pay per visit and per shot. It may be approximately $60-75+ per visit/shot, approximately $200-300 range per month depending  [facility] Address: [address] [place], [place] [address] Phone: [phone]    DUE for FASTING BLOOD WORK (no food or drink after midnight before the lab appointment, only water or coffee without cream/sugar on the morning of)  SCHEDULE "Lab Only" visit in the morning at the clinic for lab draw in 3 MONTHS   - Make sure Lab Only appointment is at about 1 week before your next appointment, so that results will be available  For Lab Results, once available within 2-3  days of blood draw, you can can log in to MyChart online to view your results and a brief explanation. Also, we can discuss results at next follow-up visit.   Please schedule a Follow-up Appointment to: Return in about 3 months (around 01/28/2023) for 3 month fasting lab only then 1 week later Annual Physical.  If you have any other questions or concerns, please feel free to call the office or send a message through MyChart. You may also schedule an earlier appointment if necessary.  Additionally, you may be receiving a survey about your experience at our office within a few days to 1 week by e-mail or mail. We value your feedback.  Saralyn Pilar, DO [facility], [place]

## 2022-10-29 ENCOUNTER — Other Ambulatory Visit: Payer: Self-pay | Admitting: Family Medicine

## 2022-10-29 ENCOUNTER — Encounter: Payer: Self-pay | Admitting: Family Medicine

## 2022-10-29 DIAGNOSIS — R7309 Other abnormal glucose: Secondary | ICD-10-CM

## 2022-10-29 DIAGNOSIS — I10 Essential (primary) hypertension: Secondary | ICD-10-CM

## 2022-10-29 DIAGNOSIS — E7800 Pure hypercholesterolemia, unspecified: Secondary | ICD-10-CM

## 2022-10-29 DIAGNOSIS — Z125 Encounter for screening for malignant neoplasm of prostate: Secondary | ICD-10-CM

## 2022-10-29 DIAGNOSIS — Z0000 Encounter for general adult medical examination without abnormal findings: Secondary | ICD-10-CM

## 2022-10-29 NOTE — Telephone Encounter (Signed)
Requested medication (s) are due for refill today - no  Requested medication (s) are on the active medication list -yes  Future visit scheduled -yes  Last refill: 10/28/22  Notes to clinic: Pharmacy request:not covered- alternative requested  Requested Prescriptions  Pending Prescriptions Disp Refills   ZEPBOUND 2.5 MG/0.5ML Pen [Pharmacy Med Name: ZEPBOUND 2.5 MG/0.5 ML PEN]  0    Sig: INJECT 2.5 MG SUBCUTANEOUSLY WEEKLY     Off-Protocol Failed - 10/28/2022  4:08 PM      Failed - Medication not assigned to a protocol, review manually.      Passed - Valid encounter within last 12 months    Recent Outpatient Visits           Yesterday Morbid obesity with BMI of 70 and over, adult [facility])   [place] [facility], Netta Neat, DO   5 months ago Morbid obesity with BMI of 70 and over, adult [facility])   [place] [facility] Smitty Cords, DO   10 months ago Annual physical exam   [facility] Smitty Cords, DO   1 year ago Morbid obesity with BMI of 60.0-69.9, adult [facility])   [facility] [place], Netta Neat, DO   1 year ago Morbid obesity with BMI of 60.0-69.9, adult [facility])   [place] [facility] Althea Charon, Netta Neat, DO       Future Appointments             In 3 months Althea Charon, Netta Neat, DO [facility], [place]               Requested Prescriptions  Pending Prescriptions Disp Refills   ZEPBOUND 2.5 MG/0.5ML Pen [Pharmacy Med Name: ZEPBOUND 2.5 MG/0.5 ML PEN]  0    Sig: INJECT 2.5 MG SUBCUTANEOUSLY WEEKLY     Off-Protocol Failed - 10/28/2022  4:08 PM      Failed - Medication not assigned to a protocol, review manually.      Passed - Valid encounter within last 12 months    Recent Outpatient Visits           Yesterday Morbid obesity with BMI of 70 and over, adult [facility])   [place] [facility] [place], Netta Neat, DO   5 months ago Morbid obesity with BMI of 70 and over, adult [facility])   [place] [facility] Smitty Cords, DO   10 months ago Annual physical exam   [place] [facility] Smitty Cords, DO   1 year ago Morbid obesity with BMI of 60.0-69.9, adult [facility])   [place] [facility] Smitty Cords, DO   1 year ago Morbid obesity with BMI of 60.0-69.9, adult [facility])   [place] [facility] Althea Charon, Netta Neat, DO       Future Appointments             In 3 months Althea Charon, Netta Neat, DO  [facility], [facility]

## 2022-12-13 ENCOUNTER — Other Ambulatory Visit: Payer: Self-pay | Admitting: Family Medicine

## 2022-12-13 DIAGNOSIS — I10 Essential (primary) hypertension: Secondary | ICD-10-CM

## 2022-12-14 NOTE — Telephone Encounter (Signed)
Requested Prescriptions  Pending Prescriptions Disp Refills   amLODipine (NORVASC) 10 MG tablet [Pharmacy Med Name: AMLODIPINE BESYLATE 10 MG TAB] 90 tablet 0    Sig: TAKE 1 TABLET BY MOUTH EVERY DAY     Cardiovascular: Calcium Channel Blockers 2 Passed - 12/13/2022  8:32 AM      Passed - Last BP in normal range    BP Readings from Last 1 Encounters:  10/29/22 138/88         Passed - Last Heart Rate in normal range    Pulse Readings from Last 1 Encounters:  10/28/22 87         Passed - Valid encounter within last 6 months    Recent Outpatient Visits           1 month ago Morbid obesity with BMI of 70 and over, adult [facility])   [place] [place], Netta Neat, DO   7 months ago Morbid obesity with BMI of 70 and over, adult [facility])   [place] [facility] Smitty Cords, DO   11 months ago Annual physical exam   [place] [facility] Smitty Cords, DO   1 year ago Morbid obesity with BMI of 60.0-69.9, adult [facility])   [place] [place], Netta Neat, DO   1 year ago Morbid obesity with BMI of 60.0-69.9, adult [facility])   [place] [facility] [place], Netta Neat, DO       Future Appointments             In 1 month Althea Charon, Netta Neat, DO [place] [facility], [place] [facility]

## 2023-01-20 ENCOUNTER — Other Ambulatory Visit: Payer: BC Managed Care – PPO

## 2023-01-20 DIAGNOSIS — R7309 Other abnormal glucose: Secondary | ICD-10-CM

## 2023-01-20 DIAGNOSIS — Z125 Encounter for screening for malignant neoplasm of prostate: Secondary | ICD-10-CM

## 2023-01-20 DIAGNOSIS — E7800 Pure hypercholesterolemia, unspecified: Secondary | ICD-10-CM | POA: Diagnosis not present

## 2023-01-20 DIAGNOSIS — I10 Essential (primary) hypertension: Secondary | ICD-10-CM | POA: Diagnosis not present

## 2023-01-20 DIAGNOSIS — Z0000 Encounter for general adult medical examination without abnormal findings: Secondary | ICD-10-CM

## 2023-01-21 LAB — CBC WITH DIFFERENTIAL/PLATELET
Absolute Lymphocytes: 1689 {cells}/uL (ref 850–3900)
Absolute Monocytes: 414 {cells}/uL (ref 200–950)
Basophils Absolute: 21 {cells}/uL (ref 0–200)
Basophils Relative: 0.5 %
Eosinophils Absolute: 193 {cells}/uL (ref 15–500)
Eosinophils Relative: 4.7 %
HCT: 42.3 % (ref 38.5–50.0)
Hemoglobin: 14 g/dL (ref 13.2–17.1)
MCH: 30.2 pg (ref 27.0–33.0)
MCHC: 33.1 g/dL (ref 32.0–36.0)
MCV: 91.4 fL (ref 80.0–100.0)
MPV: 12.1 fL (ref 7.5–12.5)
Monocytes Relative: 10.1 %
Neutro Abs: 1784 {cells}/uL (ref 1500–7800)
Neutrophils Relative %: 43.5 %
Platelets: 165 10*3/uL (ref 140–400)
RBC: 4.63 10*6/uL (ref 4.20–5.80)
RDW: 12.7 % (ref 11.0–15.0)
Total Lymphocyte: 41.2 %
WBC: 4.1 10*3/uL (ref 3.8–10.8)

## 2023-01-21 LAB — TSH: TSH: 3.54 m[IU]/L (ref 0.40–4.50)

## 2023-01-21 LAB — COMPLETE METABOLIC PANEL WITH GFR
AG Ratio: 1.4 (calc) (ref 1.0–2.5)
ALT: 23 U/L (ref 9–46)
AST: 15 U/L (ref 10–40)
Albumin: 4.1 g/dL (ref 3.6–5.1)
Alkaline phosphatase (APISO): 49 U/L (ref 36–130)
BUN: 14 mg/dL (ref 7–25)
CO2: 27 mmol/L (ref 20–32)
Calcium: 9.1 mg/dL (ref 8.6–10.3)
Chloride: 103 mmol/L (ref 98–110)
Creat: 0.83 mg/dL (ref 0.60–1.26)
Globulin: 2.9 g/dL (ref 1.9–3.7)
Glucose, Bld: 95 mg/dL (ref 65–99)
Potassium: 4.6 mmol/L (ref 3.5–5.3)
Sodium: 139 mmol/L (ref 135–146)
Total Bilirubin: 0.4 mg/dL (ref 0.2–1.2)
Total Protein: 7 g/dL (ref 6.1–8.1)
eGFR: 114 mL/min/{1.73_m2} (ref >=60)

## 2023-01-21 LAB — HEMOGLOBIN A1C
Hgb A1c MFr Bld: 5.7 %{Hb} — ABNORMAL HIGH (ref <5.7)
Mean Plasma Glucose: 117 mg/dL
eAG (mmol/L): 6.5 mmol/L

## 2023-01-21 LAB — LIPID PANEL
Cholesterol: 172 mg/dL (ref <200)
HDL: 45 mg/dL (ref >=40)
LDL Cholesterol (Calc): 112 mg/dL — ABNORMAL HIGH
Non-HDL Cholesterol (Calc): 127 mg/dL (ref <130)
Total CHOL/HDL Ratio: 3.8 (calc) (ref <5.0)
Triglycerides: 66 mg/dL (ref <150)

## 2023-01-21 LAB — PSA: PSA: 0.29 ng/mL (ref <=4.00)

## 2023-01-26 ENCOUNTER — Ambulatory Visit (INDEPENDENT_AMBULATORY_CARE_PROVIDER_SITE_OTHER): Payer: Self-pay | Admitting: Plastic Surgery

## 2023-01-26 ENCOUNTER — Encounter: Payer: Self-pay | Admitting: Plastic Surgery

## 2023-01-26 VITALS — BP 197/82 | HR 100 | Wt >= 6400 oz

## 2023-01-26 DIAGNOSIS — E881 Lipodystrophy, not elsewhere classified: Secondary | ICD-10-CM

## 2023-01-26 NOTE — Progress Notes (Signed)
Patient ID: Nathan Flynn, male    DOB: 03-04-1986, 37 y.o.   MRN: [id number]   Chief Complaint  Patient presents with   Skin Problem    The patient is a 37 year old male here with his wife for evaluation of his thighs.  He is 6 feet tall and weighs 519 pounds.  Around 2018 he weighed between 3 and 400 pounds and was very active at work.  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  This affected him tremendously and his weight went above 500 pounds.  He has tried a few medications that have either made him sick or helped a little bit and is now back on track to trying to lose.  He had to come off of the medication while he had some oral surgery done.  His right leg circumference at the knee is 64 cm at the calf is between 50 and 60 cm and at the ankle 37 cm.  The left leg around the knee is 62 cm at the calf is around 60 cm and at the ankle is 42 cm.  His pulses are palpable the majority of the excess is in the medial thigh area which is dramatic and significant as noted in the pictures.  At the moment he does not have skin breakdown.    Review of Systems  Constitutional:  Positive for activity change. Negative for appetite change.  HENT: Negative.    Eyes: Negative.   Respiratory: Negative.    Cardiovascular: Negative.   Gastrointestinal: Negative.   Endocrine: Negative.   Genitourinary: Negative.   Musculoskeletal:  Positive for back pain.  Skin:  Positive for rash.    Past Medical History:  Diagnosis Date   History of kidney stones    Kidney stone     Past Surgical History:  Procedure Laterality Date   CYSTOSCOPY W/ URETERAL STENT PLACEMENT Left 06/01/2016   Procedure: CYSTOSCOPY WITH STENT  REPLACMENT;  Surgeon: Vanna Scotland, MD;  Location: [facility];  Service: Urology;  Laterality: Left;   CYSTOSCOPY WITH STENT PLACEMENT Left 05/25/2016   Procedure: CYSTOSCOPY WITH STENT PLACEMENT;  Surgeon: Vanna Scotland, MD;  Location: [facility];   Service: Urology;  Laterality: Left;   CYSTOSCOPY/URETEROSCOPY/HOLMIUM LASER/STENT PLACEMENT Right 07/15/2020   Procedure: CYSTOSCOPY/URETEROSCOPY/STENT PLACEMENT;  Surgeon: Vanna Scotland, MD;  Location: [facility];  Service: Urology;  Laterality: Right;   CYSTOSCOPY/URETEROSCOPY/HOLMIUM LASER/STENT PLACEMENT Right 07/22/2020   Procedure: CYSTOSCOPY/URETEROSCOPY/HOLMIUM LASER/STENT PLACEMENT;  Surgeon: Vanna Scotland, MD;  Location: [facility];  Service: Urology;  Laterality: Right;   none     URETEROSCOPY Left 05/25/2016   Procedure: URETEROSCOPY;  Surgeon: Vanna Scotland, MD;  Location: [facility];  Service: Urology;  Laterality: Left;   URETEROSCOPY WITH HOLMIUM LASER LITHOTRIPSY Left 06/01/2016   Procedure: URETEROSCOPY WITH HOLMIUM LASER LITHOTRIPSY;  Surgeon: Vanna Scotland, MD;  Location: [facility];  Service: Urology;  Laterality: Left;      Current Outpatient Medications:    amLODipine (NORVASC) 10 MG tablet, TAKE 1 TABLET BY MOUTH EVERY DAY, Disp: 90 tablet, Rfl: 0   fluticasone (FLONASE) 50 MCG/ACT nasal spray, Place 2 sprays into both nostrils daily. Use for 4-6 weeks then stop and use seasonally or as needed. (Patient not taking: Reported on 10/28/2022), Disp: 16 g, Rfl: 3   hydrochlorothiazide (HYDRODIURIL) 25 MG tablet, Take 1 tablet (25 mg total) by mouth daily. (Patient not taking: Reported on 10/28/2022), Disp: 90 tablet, Rfl: 3   Insulin Pen  Needle (UNIFINE PENTIPS) 31G X 8 MM MISC, Inject Saxenda daily into skin. (Patient not taking: Reported on 05/05/2022), Disp: 90 each, Rfl: 3   nitroGLYCERIN (NITROSTAT) 0.4 MG SL tablet, Place 1 tablet (0.4 mg total) under the tongue every 5 (five) minutes as needed for chest pain., Disp: 30 tablet, Rfl: 2   ZEPBOUND 2.5 MG/0.5ML Pen, Inject 2.5 mg into the skin once a week., Disp: 2 mL, Rfl: 0   Objective:   Vitals:   01/26/23 1314  BP: (!) 197/82  Pulse: 100  SpO2: 97%    Physical Exam Vitals and nursing note reviewed.  Constitutional:       Appearance: Normal appearance.  HENT:     Head: Normocephalic and atraumatic.  Cardiovascular:     Rate and Rhythm: Normal rate.     Pulses: Normal pulses.  Pulmonary:     Effort: Pulmonary effort is normal.  Abdominal:     Palpations: Abdomen is soft.  Musculoskeletal:        General: No swelling or deformity.  Skin:    General: Skin is warm.     Capillary Refill: Capillary refill takes less than 2 seconds.     Coloration: Skin is not jaundiced.     Findings: No bruising.  Neurological:     Mental Status: He is alert and oriented to person, place, and time.  Psychiatric:        Mood and Affect: Mood normal.        Behavior: Behavior normal.        Thought Content: Thought content normal.        Judgment: Judgment normal.     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  I do want to time it in such a way that it is at the optimal time for healing at its maximum point and not debilitating him in any way.  Since he is at the beginning of the weight loss journey I am going to ask him to continue with that.  I will also ask if he will at least do a consult with the gastric bypass surgeons.  I am also going to see if we can get him juxta lites for his lower legs to decrease the edema.  Will talk to them in a month to see if he has been able to get the juxta lites and then we will plan on talking them again in 6 months to see where he is in his weight loss journey.  Pictures were obtained of the patient and placed in the chart with the patient's or guardian's permission.   Alena Bills Darden Flemister, DO

## 2023-01-27 ENCOUNTER — Other Ambulatory Visit: Payer: Self-pay | Admitting: Family Medicine

## 2023-01-27 ENCOUNTER — Encounter: Payer: Self-pay | Admitting: Family Medicine

## 2023-01-27 ENCOUNTER — Ambulatory Visit (INDEPENDENT_AMBULATORY_CARE_PROVIDER_SITE_OTHER): Payer: BC Managed Care – PPO | Admitting: Family Medicine

## 2023-01-27 VITALS — BP 150/90 | Ht 72.0 in | Wt >= 6400 oz

## 2023-01-27 DIAGNOSIS — Z6841 Body Mass Index (BMI) 40.0 and over, adult: Secondary | ICD-10-CM

## 2023-01-27 DIAGNOSIS — E7800 Pure hypercholesterolemia, unspecified: Secondary | ICD-10-CM

## 2023-01-27 DIAGNOSIS — Z0000 Encounter for general adult medical examination without abnormal findings: Secondary | ICD-10-CM

## 2023-01-27 DIAGNOSIS — R7309 Other abnormal glucose: Secondary | ICD-10-CM

## 2023-01-27 DIAGNOSIS — I10 Essential (primary) hypertension: Secondary | ICD-10-CM

## 2023-01-27 MED ORDER — VALSARTAN 80 MG PO TABS: 80.0000 mg | ORAL_TABLET | Freq: Every day | ORAL | 1 refills | Status: DC

## 2023-01-27 MED ORDER — TIRZEPATIDE-WEIGHT MANAGEMENT 5 MG/0.5ML ~~LOC~~ SOLN: 5.0000 mg | SUBCUTANEOUS | 0 refills | Status: AC

## 2023-01-27 MED ORDER — AMLODIPINE BESYLATE 10 MG PO TABS: 10.0000 mg | ORAL_TABLET | Freq: Every day | ORAL | 1 refills | Status: DC

## 2023-01-27 NOTE — Patient Instructions (Addendum)
Thank you for coming to the office today.  New medication Valsartan 80mg  daily, new BP medication. Works in kidney. We can repeat lab in 6 weeks to repeat first.  New order to [facility] for Zepbound, fax for Tirzepatide 5mg  weekly inj  Likely the Left ear pain is referred from the dental / oral.  I will chat with your plastic surgeon and update them and discuss future plans.   DUE for NON FASTING BLOOD WORK   SCHEDULE "Lab Only" visit in the morning at the clinic for lab draw in  6 weeks roughly  - Make sure Lab Only appointment is at about 1 week before your next appointment, so that results will be available  For Lab Results, once available within 2-3 days of blood draw, you can can log in to MyChart online to view your results and a brief explanation. Also, we can discuss results at next follow-up visit.   Please schedule a Follow-up Appointment to: Return in about 6 weeks (around 03/10/2023) for 6 week lab draw BMET, then 3 month POC A1c, Weight.  If you have any other questions or concerns, please feel free to call the office or send a message through MyChart. You may also schedule an earlier appointment if necessary.  Additionally, you may be receiving a survey about your experience at our office within a few days to 1 week by e-mail or mail. We value your feedback.  Nathan Pilar, DO [facility], [place]

## 2023-01-27 NOTE — Assessment & Plan Note (Signed)
Morbid Obesity BMI >70 Mild gradual initial weight loss on Tirzepatide 2.5mg  compounded inj after 4 weeks. Dose increase today new rx called in to [facility] for Tirzepatide 5mg  weekly inj compounded. Recommend dose inc monthly for now until stable dosing reached. Likely 10mg  dose or higher.  Complications from obesity include HYPERTENSION, Chronic Joint Pain  Considering Bariatric Surgery in future. But first he plans to lose weight with lifestyle and medication and avoid surgery. Consult from Plastics recently recommended pursuing continued weight loss at this time and re-evaluate for possibility of this lower extremity surgery if indicated in future.

## 2023-01-27 NOTE — Progress Notes (Signed)
Subjective:    Patient ID: Nathan Flynn, male    DOB: 12/06/1985, 37 y.o.   MRN: [id number]  Nathan Flynn is a 37 y.o. male presenting on 01/27/2023 for Annual Exam   HPI  Discussed the use of AI scribe software for clinical note transcription with the patient, who gave verbal consent to proceed.  Morbid obesity BMI >70  The patient, with a history of hypertension and obesity, presents for an annual check-up. They report a recent dental surgery due to a tooth infection, which required a temporary cessation of their weight loss medication, Tirzepatide overall for about 3-4 weeks.  Now 4 weeks ago, the patient has since resumed the medication and reports no adverse effects (Note in past he had GI intolerance on other GLP therapy). They note a slight weight loss since the last visit. - He is ready for dose increase on Tirzepatide injection 2.5mg  weekly currently, uses vial for discounted rate. He will need order before this  weekend. Sent to [facility] for compounding. - Note prior wt loss meds - Contrave, Saxenda, Wegovy.  Recent update since last visit. 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 Ultimately, they advised to pause on surgery at this time due to risks of poor healing and wound re-opening. They encourage continued weight loss and follow-up on the discussion with his progress. Also to consider Bariatric surgery options. However patient is not ready to pursue Bariatrics at this time. He prefers natural weight loss or if he has his lower extremities treated he would be able to be more mobile to exercise and lose the weight   Left Ear vs Dental Pain The patient also reports intermittent ear pain, which has been occurring since their dental surgery. The pain is described as random, occurring two to three times a day, and is localized to the left ear. The patient  also reports a sensation of swelling inside their left cheek. They deny any other associated symptoms such as jaw pain or neck pain. - He has not returned to Dentist yet, but agrees to   CHRONIC HTN: Reports elevated BP. Not checking at home. Current Meds - Amlodipine 10mg  daily - Note he is off hydrochlorothiazide 25mg  daily at this time, due to urinary frequency and has been out of med. Was not helping his edema.   Reports good compliance, took meds today. Tolerating well, w/o complaints. Denies CP, dyspnea, HA, edema, dizziness / lightheadedness  Lab results In terms of their metabolic health, the patient's recent labs show a slight increase in their A1c, placing them at the lower end of the prediabetes range. Their LDL cholesterol is slightly above the ideal range, but has shown improvement since the last measurement. The patient's thyroid function is within normal limits, but shows some fluctuation compared to previous measurements. The patient's kidney function and liver function are reported as normal.        Health Maintenance: PSA 0.29 (01/2023), negative. Prior result similar 0.28 (2023)   Of note, he has complications secondary to morbid obesity with Hypertension, Chronic Joint Pain.      01/27/2023    2:50 PM 10/28/2022    3:21 PM 05/05/2022    8:59 AM  Depression screen PHQ 2/9  Decreased Interest 0 0 0  Down, Depressed, Hopeless 0 0 0  PHQ - 2 Score 0 0 0  Altered sleeping 0 0 0  Tired, decreased energy  0 1 1  Change in appetite 0 0 0  Feeling bad or failure about yourself  0 0 0  Trouble concentrating 0 0 0  Moving slowly or fidgety/restless 0 0 0  Suicidal thoughts 0 0 0  PHQ-9 Score 0 1 1  Difficult doing work/chores  Not difficult at all Not difficult at all       01/27/2023    2:50 PM 10/28/2022    3:21 PM 05/05/2022    9:00 AM 12/30/2021    2:31 PM  GAD 7 : Generalized Anxiety Score  Nervous, Anxious, on Edge 0 0 0 0  Control/stop worrying 0 0 1 1   Worry too much - different things 0 0 1 1  Trouble relaxing 0 0 0 1  Restless 0 0 0 0  Easily annoyed or irritable 0 0 0 1  Afraid - awful might happen 0 0 0 0  Total GAD 7 Score 0 0 2 4  Anxiety Difficulty  Not difficult at all Not difficult at all Not difficult at all     Past Medical History:  Diagnosis Date   History of kidney stones    Kidney stone    Past Surgical History:  Procedure Laterality Date   CYSTOSCOPY W/ URETERAL STENT PLACEMENT Left 06/01/2016   Procedure: CYSTOSCOPY WITH STENT  REPLACMENT;  Surgeon: Vanna Scotland, MD;  Location: [facility];  Service: Urology;  Laterality: Left;   CYSTOSCOPY WITH STENT PLACEMENT Left 05/25/2016   Procedure: CYSTOSCOPY WITH STENT PLACEMENT;  Surgeon: Vanna Scotland, MD;  Location: [facility];  Service: Urology;  Laterality: Left;   CYSTOSCOPY/URETEROSCOPY/HOLMIUM LASER/STENT PLACEMENT Right 07/15/2020   Procedure: CYSTOSCOPY/URETEROSCOPY/STENT PLACEMENT;  Surgeon: Vanna Scotland, MD;  Location: [facility];  Service: Urology;  Laterality: Right;   CYSTOSCOPY/URETEROSCOPY/HOLMIUM LASER/STENT PLACEMENT Right 07/22/2020   Procedure: CYSTOSCOPY/URETEROSCOPY/HOLMIUM LASER/STENT PLACEMENT;  Surgeon: Vanna Scotland, MD;  Location: [facility];  Service: Urology;  Laterality: Right;   none     URETEROSCOPY Left 05/25/2016   Procedure: URETEROSCOPY;  Surgeon: Vanna Scotland, MD;  Location: [facility];  Service: Urology;  Laterality: Left;   URETEROSCOPY WITH HOLMIUM LASER LITHOTRIPSY Left 06/01/2016   Procedure: URETEROSCOPY WITH HOLMIUM LASER LITHOTRIPSY;  Surgeon: Vanna Scotland, MD;  Location: [facility];  Service: Urology;  Laterality: Left;   Social History   Socioeconomic History   Marital status: Married    Spouse name: Not on file   Number of children: Not on file   Years of education: Not on file   Highest education level: Not on file  Occupational History   Not on file  Tobacco Use   Smoking status: Never   Smokeless tobacco: Never   Vaping Use   Vaping status: Never Used  Substance and Sexual Activity   Alcohol use: No   Drug use: No   Sexual activity: Not on file  Other Topics Concern   Not on file  Social History Narrative   Not on file   Social Determinants of Health   Financial Resource Strain: Not on file  Food Insecurity: Not on file  Transportation Needs: Not on file  Physical Activity: Not on file  Stress: Not on file  Social Connections: Not on file  Intimate Partner Violence: Not on file   Family History  Problem Relation Age of Onset   Heart attack Father 49   Heart attack Paternal Aunt 31   Prostate cancer Neg Hx    Kidney cancer Neg Hx    Bladder Cancer Neg Hx  Current Outpatient Medications on File Prior to Visit  Medication Sig   nitroGLYCERIN (NITROSTAT) 0.4 MG SL tablet Place 1 tablet (0.4 mg total) under the tongue every 5 (five) minutes as needed for chest pain.   fluticasone (FLONASE) 50 MCG/ACT nasal spray Place 2 sprays into both nostrils daily. Use for 4-6 weeks then stop and use seasonally or as needed. (Patient not taking: Reported on 10/28/2022)   Insulin Pen Needle (UNIFINE PENTIPS) 31G X 8 MM MISC Inject Saxenda daily into skin. (Patient not taking: Reported on 05/05/2022)   No current facility-administered medications on file prior to visit.    Review of Systems  Constitutional:  Negative for activity change, appetite change, chills, diaphoresis, fatigue and fever.  HENT:  Negative for congestion and hearing loss.   Eyes:  Negative for visual disturbance.  Respiratory:  Negative for cough, chest tightness, shortness of breath and wheezing.   Cardiovascular:  Negative for chest pain, palpitations and leg swelling.  Gastrointestinal:  Negative for abdominal pain, constipation, diarrhea, nausea and vomiting.  Genitourinary:  Negative for dysuria, frequency and hematuria.  Musculoskeletal:  Negative for arthralgias and neck pain.  Skin:  Negative for rash.  Neurological:   Negative for dizziness, weakness, light-headedness, numbness and headaches.  Hematological:  Negative for adenopathy.  Psychiatric/Behavioral:  Negative for behavioral problems, dysphoric mood and sleep disturbance.    Per HPI unless specifically indicated above     Objective:    BP (!) 150/90 (BP Location: Left Arm, Cuff Size: Large)   Ht 6' (1.829 m)   Wt (!) 517 lb (234.5 kg)   BMI 70.12 kg/m   Wt Readings from Last 3 Encounters:  01/27/23 (!) 517 lb (234.5 kg)  01/26/23 (!) 519 lb (235.4 kg)  10/28/22 (!) 519 lb (235.4 kg)    Physical Exam Vitals and nursing note reviewed.  Constitutional:      General: He is not in acute distress.    Appearance: He is well-developed. He is obese. He is not diaphoretic.     Comments: Well-appearing, comfortable, cooperative  HENT:     Head: Normocephalic and atraumatic.  Eyes:     General:        Right eye: No discharge.        Left eye: No discharge.     Conjunctiva/sclera: Conjunctivae normal.     Pupils: Pupils are equal, round, and reactive to light.  Neck:     Thyroid: No thyromegaly.     Vascular: No carotid bruit.  Cardiovascular:     Rate and Rhythm: Normal rate and regular rhythm.     Pulses: Normal pulses.     Heart sounds: Normal heart sounds. No murmur heard. Pulmonary:     Effort: Pulmonary effort is normal. No respiratory distress.     Breath sounds: Normal breath sounds. No wheezing or rales.  Abdominal:     General: Bowel sounds are normal. There is no distension.     Palpations: Abdomen is soft. There is no mass.     Tenderness: There is no abdominal tenderness.  Musculoskeletal:        General: No tenderness. Normal range of motion.     Cervical back: Normal range of motion and neck supple.     Right lower leg: Edema present.     Left lower leg: Edema present.     Comments: Upper / Lower Extremities: - Normal muscle tone, strength bilateral upper extremities 5/5, lower extremities 5/5  Lymphadenopathy:  Cervical: No cervical adenopathy.  Skin:    General: Skin is warm and dry.     Findings: No erythema or rash.  Neurological:     Mental Status: He is alert and oriented to person, place, and time.     Comments: Distal sensation intact to light touch all extremities  Psychiatric:        Mood and Affect: Mood normal.        Behavior: Behavior normal.        Thought Content: Thought content normal.     Comments: Well groomed, good eye contact, normal speech and thoughts        Results for orders placed or performed in visit on 01/20/23  PSA  Result Value Ref Range   PSA 0.29 < OR = 4.00 ng/mL  TSH  Result Value Ref Range   TSH 3.54 0.40 - 4.50 mIU/L  CBC with Differential/Platelet  Result Value Ref Range   WBC 4.1 3.8 - 10.8 Thousand/uL   RBC 4.63 4.20 - 5.80 Million/uL   Hemoglobin 14.0 13.2 - 17.1 g/dL   HCT 40.9 81.1 - 91.4 %   MCV 91.4 80.0 - 100.0 fL   MCH 30.2 27.0 - 33.0 pg   MCHC 33.1 32.0 - 36.0 g/dL   RDW 78.2 95.6 - 21.3 %   Platelets 165 140 - 400 Thousand/uL   MPV 12.1 7.5 - 12.5 fL   Neutro Abs 1,784 1,500 - 7,800 cells/uL   Absolute Lymphocytes 1,689 850 - 3,900 cells/uL   Absolute Monocytes 414 200 - 950 cells/uL   Eosinophils Absolute 193 15 - 500 cells/uL   Basophils Absolute 21 0 - 200 cells/uL   Neutrophils Relative % 43.5 %   Total Lymphocyte 41.2 %   Monocytes Relative 10.1 %   Eosinophils Relative 4.7 %   Basophils Relative 0.5 %  Lipid panel  Result Value Ref Range   Cholesterol 172 <200 mg/dL   HDL 45 > OR = 40 mg/dL   Triglycerides 66 <086 mg/dL   LDL Cholesterol (Calc) 112 (H) mg/dL (calc)   Total CHOL/HDL Ratio 3.8 <5.0 (calc)   Non-HDL Cholesterol (Calc) 127 <130 mg/dL (calc)  Hemoglobin V7Q  Result Value Ref Range   Hgb A1c MFr Bld 5.7 (H) <5.7 % of total Hgb   Mean Plasma Glucose 117 mg/dL   eAG (mmol/L) 6.5 mmol/L  COMPLETE METABOLIC PANEL WITH GFR  Result Value Ref Range   Glucose, Bld 95 65 - 99 mg/dL   BUN 14 7 - 25 mg/dL    Creat 4.69 6.29 - 5.28 mg/dL   eGFR 413 > OR = 60 KG/MWN/0.27O5   BUN/Creatinine Ratio SEE NOTE: 6 - 22 (calc)   Sodium 139 135 - 146 mmol/L   Potassium 4.6 3.5 - 5.3 mmol/L   Chloride 103 98 - 110 mmol/L   CO2 27 20 - 32 mmol/L   Calcium 9.1 8.6 - 10.3 mg/dL   Total Protein 7.0 6.1 - 8.1 g/dL   Albumin 4.1 3.6 - 5.1 g/dL   Globulin 2.9 1.9 - 3.7 g/dL (calc)   AG Ratio 1.4 1.0 - 2.5 (calc)   Total Bilirubin 0.4 0.2 - 1.2 mg/dL   Alkaline phosphatase (APISO) 49 36 - 130 U/L   AST 15 10 - 40 U/L   ALT 23 9 - 46 U/L      Assessment & Plan:   Problem List Items Addressed This Visit     Essential hypertension    Still elevated  BP Secondary to morbid obesity No home BP readings Failed hydrochlorothiazide due to urinary frequency   Plan:  START Valsartan 80mg  daily Check BMET within 6 weeks for Cr K Continue Amlodipine 10mg  daily  Considering secondary causes also like OSA - discussed today, not meeting criteria for OSA but still suspicious if feeling daytime sleepiness and with obesity. Will revisit this as well if indicated  Encourage improved lifestyle - low sodium diet, regular exercise Continue to monitor BP outside office, bring readings to next visit, if persistently >140/90 or new symptoms notify office sooner      Relevant Medications   valsartan (DIOVAN) 80 MG tablet   amLODipine (NORVASC) 10 MG tablet   Morbid obesity with BMI of 70 and over, adult (HCC)    Morbid Obesity BMI >70 Mild gradual initial weight loss on Tirzepatide 2.5mg  compounded inj after 4 weeks. Dose increase today new rx called in to [facility] for Tirzepatide 5mg  weekly inj compounded. Recommend dose inc monthly for now until stable dosing reached. Likely 10mg  dose or higher.  Complications from obesity include HYPERTENSION, Chronic Joint Pain  Considering Bariatric Surgery in future. But first he plans to lose weight with lifestyle and medication and avoid surgery. Consult from Plastics  recently recommended pursuing continued weight loss at this time and re-evaluate for possibility of this lower extremity surgery if indicated in future.      Relevant Medications   tirzepatide 5 MG/0.5ML injection vial   Other Visit Diagnoses     Annual physical exam    -  Primary   Elevated LDL cholesterol level       Elevated hemoglobin A1c            Updated Health Maintenance information Reviewed recent lab results with patient Encouraged improvement to lifestyle with diet and exercise Goal of weight loss   Elevated A1c A1c increased from 5.6 to 5.7, at the threshold for prediabetes. No current need for medication, but monitoring for potential trend of higher sugar retention. -Continue monitoring A1c levels for trend.  Hyperlipidemia LDL cholesterol slightly above target at 112, down from 120 last year. No current need for medication. -Continue healthy lifestyle modifications and weight management to further reduce LDL levels.   Post-Surgical L facial vs Ear Pain Reports of intermittent ear pain following dental surgery. No abnormalities detected in ear examination. Discussion today that his ear is normal and based on his pain it seems to be radiating pain in this region. Suspect dental is the etiology He should return to Dentist for further concerns   No orders of the defined types were placed in this encounter.   Meds ordered this encounter  Medications   valsartan (DIOVAN) 80 MG tablet    Sig: Take 1 tablet (80 mg total) by mouth daily.    Dispense:  90 tablet    Refill:  1   amLODipine (NORVASC) 10 MG tablet    Sig: Take 1 tablet (10 mg total) by mouth daily.    Dispense:  90 tablet    Refill:  1   tirzepatide 5 MG/0.5ML injection vial    Sig: Inject 5 mg into the skin once a week.    Dispense:  2 mL    Refill:  0    [facility] compounded Tirzepatidie 5mg  dose     Follow up plan: Return in about 6 weeks (around 03/10/2023) for 6 week lab draw  BMET, then 3 month POC A1c, Weight.  Forward chart to Dr Ulice Bold  Plastic Surgery to review plan for his next follow-up and wt loss goals prior to considering lower extremity surgery.  Future lab 03/11/23 BMET 6 weeks  Saralyn Pilar, DO [facility] 01/27/2023, 2:55 PM

## 2023-01-27 NOTE — Assessment & Plan Note (Signed)
Still elevated BP Secondary to morbid obesity No home BP readings Failed hydrochlorothiazide due to urinary frequency   Plan:  START Valsartan 80mg  daily Check BMET within 6 weeks for Cr K Continue Amlodipine 10mg  daily  Considering secondary causes also like OSA - discussed today, not meeting criteria for OSA but still suspicious if feeling daytime sleepiness and with obesity. Will revisit this as well if indicated  Encourage improved lifestyle - low sodium diet, regular exercise Continue to monitor BP outside office, bring readings to next visit, if persistently >140/90 or new symptoms notify office sooner

## 2023-01-28 ENCOUNTER — Telehealth: Payer: Self-pay

## 2023-01-28 NOTE — Telephone Encounter (Signed)
Faxed compression wrap request to [facility] with confirmed receipt. (Faxed 01/26/2023) [employer] Juxtalite

## 2023-01-29 ENCOUNTER — Telehealth: Payer: Self-pay | Admitting: Family Medicine

## 2023-01-29 NOTE — Telephone Encounter (Signed)
Spoke with Clydie Braun at [employer] and the fax they received was not completely filled out.  They will resend to have the form completed.

## 2023-01-29 NOTE — Telephone Encounter (Signed)
Clydie Braun from [facility] called in to confirm if Dr. Althea Charon had faxed a form, for medication compound tirzepatide 5 MG weekly. No refills.   Form has Dr. Enid Skeens name, [phone] as a call back number to this office & dated 01-27-2023. Clydie Braun states Dr. Althea Charon always fills out form completely and just wanted to confirm it was from Dr. Althea Charon.

## 2023-02-01 ENCOUNTER — Encounter: Payer: Self-pay | Admitting: Family Medicine

## 2023-02-01 NOTE — Telephone Encounter (Signed)
Unsure the reason for the original incomplete form. I recall completing it prior to faxing. The only section I did leave intentionally blank was DEA number as I did not think it was required for this medication.  I have completed the new form again entirely. I did not order nausea medicine or the vitamin section.  It is ready to fax again.  Saralyn Pilar, DO [facility] 02/01/2023, 8:30 AM

## 2023-02-08 ENCOUNTER — Telehealth: Payer: Self-pay | Admitting: Plastic Surgery

## 2023-02-08 NOTE — Telephone Encounter (Signed)
Nathan Flynn, Nathan Flynn [[id number] he came in to have excess skin removed from legs, that's what his consult visit notes says, but billing is asking are we going cosmetic or insurance, The check out slep says come back in a month for weight and compression, 6 month tele visit to discuss thigh and calf sx, ref to [facility] for gastric bypass, We took a 60.00 consult fee and billing is just checking which way to bill?

## 2023-02-25 ENCOUNTER — Ambulatory Visit: Payer: Self-pay | Admitting: Student

## 2023-03-11 ENCOUNTER — Other Ambulatory Visit: Payer: BC Managed Care – PPO

## 2023-03-11 DIAGNOSIS — I10 Essential (primary) hypertension: Secondary | ICD-10-CM

## 2023-03-12 LAB — BASIC METABOLIC PANEL WITH GFR
BUN: 15 mg/dL (ref 7–25)
CO2: 30 mmol/L (ref 20–32)
Calcium: 9.6 mg/dL (ref 8.6–10.3)
Chloride: 103 mmol/L (ref 98–110)
Creat: 0.75 mg/dL (ref 0.60–1.26)
Glucose, Bld: 85 mg/dL (ref 65–139)
Potassium: 4.6 mmol/L (ref 3.5–5.3)
Sodium: 140 mmol/L (ref 135–146)
eGFR: 118 mL/min/{1.73_m2} (ref >=60)

## 2023-03-30 ENCOUNTER — Encounter: Payer: Self-pay | Admitting: Family Medicine

## 2023-03-30 ENCOUNTER — Ambulatory Visit: Payer: BC Managed Care – PPO | Admitting: Family Medicine

## 2023-03-30 VITALS — BP 139/100 | HR 93 | Ht 72.0 in | Wt >= 6400 oz

## 2023-03-30 DIAGNOSIS — R221 Localized swelling, mass and lump, neck: Secondary | ICD-10-CM

## 2023-03-30 NOTE — Patient Instructions (Addendum)
 Thank you for coming to the office today.  No obvious abnormality found on my exam. Left tonsil is slightly larger but no concern with it. Ear looks good, mild clear fluid behind the eardrum but this should not explain your symptoms. I don't necessarily feel anything wrong with the throat / neck region but we can check it out with Ultrasound.  [facility] at [facility] Address: [address] [place], [place] [address] Phone: [phone]  Please schedule a Follow-up Appointment to: Return if symptoms worsen or fail to improve.  If you have any other questions or concerns, please feel free to call the office or send a message through MyChart. You may also schedule an earlier appointment if necessary.  Additionally, you may be receiving a survey about your experience at our office within a few days to 1 week by e-mail or mail. We value your feedback.  Marsa Officer, DO [facility], [place]

## 2023-03-30 NOTE — Progress Notes (Signed)
 Subjective:    Patient ID: Nathan Flynn, male    DOB: Nov 17, 1985, 38 y.o.   MRN: [id number]  Nathan Flynn is a 38 y.o. male presenting on 03/30/2023 for neck swelling   HPI  Discussed the use of AI scribe software for clinical note transcription with the patient, who gave verbal consent to proceed.  History of Present Illness    Localized Neck Swelling  The patient presents with a chief complaint of persistent swelling on the left side of the neck, specifically in the lymph node area, which has been present for several months. He describes the swelling as consistent and noticeable, but not painful. The patient also reports sporadic, brief episodes of pain in the left eye and ear, which he suspects may be linked to the neck swelling. These episodes of pain are described as sharp in the LEFT eye and more of a twinge in the LEFT ear, occurring at random intervals, sometimes not even daily.  In addition to the neck swelling and sporadic pain, the patient notes that his left tonsil feels slightly larger and more pushed over, a sensation he can feel in his throat. However, he reports no pain associated with this sensation.  The patient also mentions experiencing some back pain, which he believes is unrelated to the neck swelling and sporadic eye and ear pain. This back pain has caused him to stop his workout routine, leading to some frustration over recent weight gain.  The patient denies any other symptoms, including vision changes, headaches, or difficulty swallowing. He also reports no worsening of symptoms over time. The patient's symptoms have not been linked to any specific triggers or timeframes, and he reports no ability to provoke the symptoms.          01/27/2023    2:50 PM 10/28/2022    3:21 PM 05/05/2022    8:59 AM  Depression screen PHQ 2/9  Decreased Interest 0 0 0  Down, Depressed, Hopeless 0 0 0  PHQ - 2 Score 0 0 0  Altered sleeping 0 0 0  Tired,  decreased energy 0 1 1  Change in appetite 0 0 0  Feeling bad or failure about yourself  0 0 0  Trouble concentrating 0 0 0  Moving slowly or fidgety/restless 0 0 0  Suicidal thoughts 0 0 0  PHQ-9 Score 0 1 1  Difficult doing work/chores  Not difficult at all Not difficult at all       01/27/2023    2:50 PM 10/28/2022    3:21 PM 05/05/2022    9:00 AM 12/30/2021    2:31 PM  GAD 7 : Generalized Anxiety Score  Nervous, Anxious, on Edge 0 0 0 0  Control/stop worrying 0 0 1 1  Worry too much - different things 0 0 1 1  Trouble relaxing 0 0 0 1  Restless 0 0 0 0  Easily annoyed or irritable 0 0 0 1  Afraid - awful might happen 0 0 0 0  Total GAD 7 Score 0 0 2 4  Anxiety Difficulty  Not difficult at all Not difficult at all Not difficult at all    Social History   Tobacco Use   Smoking status: Never   Smokeless tobacco: Never  Vaping Use   Vaping status: Never Used  Substance Use Topics   Alcohol use: No   Drug use: No    Review of Systems Per HPI unless specifically indicated above  Objective:    BP (!) 139/100   Pulse 93   Ht 6' (1.829 m)   Wt (!) 527 lb (239 kg)   BMI 71.47 kg/m   Wt Readings from Last 3 Encounters:  03/30/23 (!) 527 lb (239 kg)  01/27/23 (!) 517 lb (234.5 kg)  01/26/23 (!) 519 lb (235.4 kg)    Physical Exam Vitals and nursing note reviewed.  Constitutional:      General: He is not in acute distress.    Appearance: Normal appearance. He is well-developed. He is obese. He is not diaphoretic.     Comments: Well-appearing, comfortable, cooperative  HENT:     Head: Normocephalic and atraumatic.     Mouth/Throat:     Mouth: Mucous membranes are moist.     Pharynx: Oropharynx is clear. No oropharyngeal exudate or posterior oropharyngeal erythema.     Comments: Left tonsil tissue appears mild enlarged, but not abnormal. No sign of focal infection or mass or white patch or stone or erythema. Uvula midline other anatomy is normal in  oropharynx Eyes:     General:        Right eye: No discharge.        Left eye: No discharge.     Conjunctiva/sclera: Conjunctivae normal.  Neck:     Comments: Left upper anterior cervical possible mild fullness localized. No significant nodular density or mass. Non tender. Seems to be normal anatomy but slight fullness compared to R side Cardiovascular:     Rate and Rhythm: Normal rate.  Pulmonary:     Effort: Pulmonary effort is normal.  Musculoskeletal:     Cervical back: Normal range of motion and neck supple. No rigidity or tenderness.  Lymphadenopathy:     Cervical: No cervical adenopathy.  Skin:    General: Skin is warm and dry.     Findings: No erythema or rash.  Neurological:     Mental Status: He is alert and oriented to person, place, and time.  Psychiatric:        Mood and Affect: Mood normal.        Behavior: Behavior normal.        Thought Content: Thought content normal.     Comments: Well groomed, good eye contact, normal speech and thoughts     Results for orders placed or performed in visit on 03/11/23  BASIC METABOLIC PANEL WITH GFR   Collection Time: 03/11/23  7:53 AM  Result Value Ref Range   Glucose, Bld 85 65 - 139 mg/dL   BUN 15 7 - 25 mg/dL   Creat 9.24 9.39 - 8.73 mg/dL   eGFR 881 > OR = 60 fO/fpw/8.26f7   BUN/Creatinine Ratio SEE NOTE: 6 - 22 (calc)   Sodium 140 135 - 146 mmol/L   Potassium 4.6 3.5 - 5.3 mmol/L   Chloride 103 98 - 110 mmol/L   CO2 30 20 - 32 mmol/L   Calcium 9.6 8.6 - 10.3 mg/dL      Assessment & Plan:   Problem List Items Addressed This Visit   None Visit Diagnoses       Localized swelling, mass and lump, neck    -  Primary   Relevant Orders   US  Soft Tissue Head/Neck (NON-THYROID )        Localized Anterior upper Neck Swelling Left sided Patient reports consistent swelling in the left upper neck region for the past two months. No pain associated with the swelling. Left tonsil appears slightly larger than the  right,  but no other abnormalities noted on physical examination. Reassurance provided today given lack of physical findings or concerning symptoms. Suspect the episodic twinge brief pain on L side sinus / ear is related to referred pain.  -Order ultrasound of the neck to further evaluate the swelling. Consider 2nd opinion in future if indicated  Obesity / Back Pain Not focus of visit today Will review at upcoming apt     Orders Placed This Encounter  Procedures   US  Soft Tissue Head/Neck (NON-THYROID )    Standing Status:   Future    Expiration Date:   03/29/2024    Reason for Exam (SYMPTOM  OR DIAGNOSIS REQUIRED):   2+ months Left upper anterior neck swelling questionable LAD or other abnormal structure. Some referred pain    Preferred imaging location?:   [facility]    No orders of the defined types were placed in this encounter.   Follow up plan: Return if symptoms worsen or fail to improve.   Marsa Officer, DO [facility] 03/30/2023, 9:25 AM

## 2023-04-02 ENCOUNTER — Ambulatory Visit: Admission: RE | Admit: 2023-04-02 | Payer: BC Managed Care – PPO | Source: Ambulatory Visit

## 2023-04-16 ENCOUNTER — Ambulatory Visit: Payer: BC Managed Care – PPO

## 2023-04-16 ENCOUNTER — Ambulatory Visit
Admission: RE | Admit: 2023-04-16 | Discharge: 2023-04-16 | Disposition: A | Discharge status: Home / Self Care | Payer: BC Managed Care – PPO | Source: Ambulatory Visit | Attending: Family Medicine | Admitting: Family Medicine

## 2023-04-16 DIAGNOSIS — R221 Localized swelling, mass and lump, neck: Secondary | ICD-10-CM | POA: Diagnosis not present

## 2023-04-20 ENCOUNTER — Encounter: Payer: Self-pay | Admitting: Family Medicine

## 2023-04-20 DIAGNOSIS — J0110 Acute frontal sinusitis, unspecified: Secondary | ICD-10-CM

## 2023-04-26 MED ORDER — AMOXICILLIN-POT CLAVULANATE 875-125 MG PO TABS: 1.0000 | ORAL_TABLET | Freq: Two times a day (BID) | ORAL | 0 refills | Status: DC

## 2023-04-28 ENCOUNTER — Encounter: Payer: Self-pay | Admitting: Family Medicine

## 2023-04-28 ENCOUNTER — Ambulatory Visit (INDEPENDENT_AMBULATORY_CARE_PROVIDER_SITE_OTHER): Payer: BC Managed Care – PPO | Admitting: Family Medicine

## 2023-04-28 VITALS — BP 157/76 | HR 93 | Ht 72.0 in | Wt >= 6400 oz

## 2023-04-28 DIAGNOSIS — J0110 Acute frontal sinusitis, unspecified: Secondary | ICD-10-CM

## 2023-04-28 DIAGNOSIS — I10 Essential (primary) hypertension: Secondary | ICD-10-CM | POA: Diagnosis not present

## 2023-04-28 DIAGNOSIS — Z6841 Body Mass Index (BMI) 40.0 and over, adult: Secondary | ICD-10-CM

## 2023-04-28 NOTE — Progress Notes (Signed)
Subjective:    Patient ID: Nathan Flynn, male    DOB: January 05, 1986, 38 y.o.   MRN: [id number]  Nathan Flynn is a 38 y.o. male presenting on 04/28/2023 for Obesity (/)   HPI  Discussed the use of AI scribe software for clinical note transcription with the patient, who gave verbal consent to proceed.  History of Present Illness   [name] is a 38 year old male who presents with ear pain and weight management concerns.  He has been experiencing ear pain that began intermittently but has become more consistent over the past few days. The pain radiates from his ear to his jawbone, eye, and neck, with a pain level of 3 to 4 out of 10. He also experienced a fever reaching 102F over the past weekend. He has a history of similar symptoms during sinus infections. He started taking Augmentin yesterday morning and has taken three doses so far.  He mentions a persistent swelling in the neck area, which he associates with a past dental surgery where teeth were removed. The swelling has been present for months, initially sporadic but now more consistent. He describes it as a mass under the jaw and neck, with a swollen left tonsil. He has had a neck soft tissue ultrasound which indicated mildly prominent lymph nodes, presumed to be reactive (03/2023)  He is concerned about weight management, reporting a recent weight gain due to a back injury that prevented him from exercising and a delay in receiving his weight loss medication, Tirzepatide. He has resumed weightlifting and is currently on a 5 mg dose of Tirzepatide, with two more doses left before needing a refill. He notes that the medication has not caused any side effects and believes it may be helping with appetite suppression. His weight has decreased from 527 lbs in January to 484 lbs currently.  Elevated BP / Hypertension No major issues with blood pressure, noting it fluctuates but is managed with medication.            04/28/2023    3:58 PM 01/27/2023    2:50 PM 10/28/2022    3:21 PM  Depression screen PHQ 2/9  Decreased Interest 0 0 0  Down, Depressed, Hopeless 0 0 0  PHQ - 2 Score 0 0 0  Altered sleeping  0 0  Tired, decreased energy  0 1  Change in appetite  0 0  Feeling bad or failure about yourself   0 0  Trouble concentrating  0 0  Moving slowly or fidgety/restless  0 0  Suicidal thoughts  0 0  PHQ-9 Score  0 1  Difficult doing work/chores   Not difficult at all       04/28/2023    3:58 PM 01/27/2023    2:50 PM 10/28/2022    3:21 PM 05/05/2022    9:00 AM  GAD 7 : Generalized Anxiety Score  Nervous, Anxious, on Edge 0 0 0 0  Control/stop worrying 0 0 0 1  Worry too much - different things 0 0 0 1  Trouble relaxing 0 0 0 0  Restless 0 0 0 0  Easily annoyed or irritable 0 0 0 0  Afraid - awful might happen 0 0 0 0  Total GAD 7 Score 0 0 0 2  Anxiety Difficulty Not difficult at all  Not difficult at all Not difficult at all    Social History   Tobacco Use   Smoking status: Never   Smokeless  tobacco: Never  Vaping Use   Vaping status: Never Used  Substance Use Topics   Alcohol use: No   Drug use: No    Review of Systems Per HPI unless specifically indicated above     Objective:    BP (!) 157/76   Pulse 93   Ht 6' (1.829 m)   Wt (!) 484 lb (219.5 kg)   SpO2 98%   BMI 65.64 kg/m   Wt Readings from Last 3 Encounters:  04/28/23 (!) 484 lb (219.5 kg)  03/30/23 (!) 527 lb (239 kg)  01/27/23 (!) 517 lb (234.5 kg)    Physical Exam Vitals and nursing note reviewed.  Constitutional:      General: He is not in acute distress.    Appearance: Normal appearance. He is well-developed. He is obese. He is not diaphoretic.     Comments: Well-appearing, comfortable, cooperative  HENT:     Head: Normocephalic and atraumatic.     Right Ear: Ear canal and external ear normal. There is no impacted cerumen.     Left Ear: Ear canal and external ear normal. There is no  impacted cerumen.     Ears:     Comments: L TM with effusion some inflammation. No obvious erythema. No bulging. R TM mild effusion    Mouth/Throat:     Comments: L tonsil slightly enlarged compared to R Eyes:     General:        Right eye: No discharge.        Left eye: No discharge.     Conjunctiva/sclera: Conjunctivae normal.  Cardiovascular:     Rate and Rhythm: Normal rate.  Pulmonary:     Effort: Pulmonary effort is normal.  Lymphadenopathy:     Cervical: Cervical adenopathy (anterior submandibular region mild today) present.  Skin:    General: Skin is warm and dry.     Findings: No erythema or rash.  Neurological:     Mental Status: He is alert and oriented to person, place, and time.  Psychiatric:        Mood and Affect: Mood normal.        Behavior: Behavior normal.        Thought Content: Thought content normal.     Comments: Well groomed, good eye contact, normal speech and thoughts    I have personally reviewed the radiology report from 04/16/23 on Neck Soft Tissue US.  CLINICAL DATA:  Left superior anterior neck swelling for the past 2 months.   EXAM: ULTRASOUND OF HEAD/NECK SOFT TISSUES   TECHNIQUE: Ultrasound examination of the head and neck soft tissues was performed in the area of clinical concern.   COMPARISON:  None Available.   FINDINGS: Sonographic evaluation of the patient's palpable area of concern involving the submandibular region of the left side of the neck correlates with mildly prominent though non pathologically enlarged and benign-appearing left submandibular and cervical lymph nodes with index left cervical lymph node measuring 1 cm in greatest short axis diameter and maintaining a benign fatty hilum (image 11). No evidence of suppuration.   IMPRESSION: Sonographic evaluation of the patient's palpable area of concern involving the submandibular region of the left side of the neck correlates with mildly prominent though non  pathologically enlarged and benign-appearing left submandibular and cervical lymph nodes, presumably reactive in etiology.     Electronically Signed   By: Simonne Come M.D.   On: 04/17/2023 12:08    Results for orders placed or performed in  visit on 03/11/23  BASIC METABOLIC PANEL WITH GFR   Collection Time: 03/11/23  7:53 AM  Result Value Ref Range   Glucose, Bld 85 65 - 139 mg/dL   BUN 15 7 - 25 mg/dL   Creat 4.09 8.11 - 9.14 mg/dL   eGFR 782 > OR = 60 NF/AOZ/3.08M5   BUN/Creatinine Ratio SEE NOTE: 6 - 22 (calc)   Sodium 140 135 - 146 mmol/L   Potassium 4.6 3.5 - 5.3 mmol/L   Chloride 103 98 - 110 mmol/L   CO2 30 20 - 32 mmol/L   Calcium 9.6 8.6 - 10.3 mg/dL      Assessment & Plan:   Problem List Items Addressed This Visit     Essential hypertension   Morbid obesity with BMI of 60.0-69.9, adult (HCC) - Primary   Other Visit Diagnoses       Acute non-recurrent frontal sinusitis            Sinusitis L Ear Pain and Swelling Recent onset of ear pain, jaw pain, and eye pain, along with neck soreness. Symptoms are similar to previous sinus infection. Patient has started Augmentin recently. Examination reveals inflammation and fluid behind both eardrums, more on the left side. -Continue Augmentin as prescribed (recently) -Reevaluate if symptoms persist or worsen.  Anterior Neck / Submandibular / Swollen Lymph Nodes Suspected "reactive" LAD Persistent swelling in the submandibular region of the neck, possibly related to a previous dental procedure. Recent Neck Ultrasound showed mildly prominent but not pathologically enlarged lymph nodes. -Monitor the swelling. -Consider CT scan or ENT referral if swelling persists or worsens.  Morbid Obesity BMI >65 Weight Management Patient is on compounded Tirzepatide for weight loss and has resumed physical activity. No adverse effects reported from the medication. -Continue Tirzepatide as prescribed, titration of dose 2.5mg  weekly  x 4 weeks > up to 5mg  weekly, he has 2 weeks remaining He will check into alternative sources for compounded medication Tirzepatide, various online options given to patient today on [facility] - He can notify us within 2 weeks if need re order locally to [place] or if he can order online -Continue physical activity and healthy diet.  Hypertension Patient is on medication for hypertension. Blood pressure fluctuates but patient hopes weight loss will help manage it. -Continue current hypertension medication. -Monitor blood pressure regularly. -Consider adjusting medication if blood pressure remains high despite weight loss.         No orders of the defined types were placed in this encounter.   No orders of the defined types were placed in this encounter.   Follow up plan: Return if symptoms worsen or fail to improve.   Saralyn Pilar, DO [facility] 04/28/2023, 4:15 PM

## 2023-04-28 NOTE — Patient Instructions (Addendum)
Thank you for coming to the office today.  Check some online sources  ro.co  Weight Loss Treatments for Men  hims  www.hims.com/weight-loss  Location manager With insurance $79/mo + copay for medications Without insurance $79/mo + $99/mo to include medication (Semaglutide) Without insurance $79/mo + $199/mo to include medication (Tirzepatide) https://www.[place].info/  We can consider CT Scan vs ENT referral in future if we still are striking out. May even reconsider alternative antibiotic courses in the future if still needed  Both ears have some fluid behind them, L > R with some slight inflammation on the ear drum.   Please schedule a Follow-up Appointment to: Return if symptoms worsen or fail to improve.  If you have any other questions or concerns, please feel free to call the office or send a message through MyChart. You may also schedule an earlier appointment if necessary.  Additionally, you may be receiving a survey about your experience at our office within a few days to 1 week by e-mail or mail. We value your feedback.  Saralyn Pilar, DO [facility], [place]

## 2023-06-29 ENCOUNTER — Encounter: Payer: Self-pay | Admitting: Plastic Surgery

## 2023-06-29 ENCOUNTER — Ambulatory Visit: Payer: Self-pay | Admitting: Plastic Surgery

## 2023-06-29 DIAGNOSIS — E881 Lipodystrophy, not elsewhere classified: Secondary | ICD-10-CM

## 2023-06-29 NOTE — Progress Notes (Signed)
 Left message

## 2023-07-21 ENCOUNTER — Encounter: Payer: Self-pay | Admitting: Family Medicine

## 2023-07-21 ENCOUNTER — Ambulatory Visit: Admitting: Family Medicine

## 2023-07-21 VITALS — BP 140/88 | HR 75 | Ht 72.0 in | Wt >= 6400 oz

## 2023-07-21 DIAGNOSIS — H6992 Unspecified Eustachian tube disorder, left ear: Secondary | ICD-10-CM

## 2023-07-21 DIAGNOSIS — Z6841 Body Mass Index (BMI) 40.0 and over, adult: Secondary | ICD-10-CM

## 2023-07-21 DIAGNOSIS — J3489 Other specified disorders of nose and nasal sinuses: Secondary | ICD-10-CM | POA: Diagnosis not present

## 2023-07-21 DIAGNOSIS — L987 Excessive and redundant skin and subcutaneous tissue: Secondary | ICD-10-CM

## 2023-07-21 DIAGNOSIS — R29818 Other symptoms and signs involving the nervous system: Secondary | ICD-10-CM

## 2023-07-21 DIAGNOSIS — G43909 Migraine, unspecified, not intractable, without status migrainosus: Secondary | ICD-10-CM | POA: Diagnosis not present

## 2023-07-21 DIAGNOSIS — G4719 Other hypersomnia: Secondary | ICD-10-CM

## 2023-07-21 MED ORDER — FLUTICASONE PROPIONATE 50 MCG/ACT NA SUSP: 2.0000 | Freq: Every day | NASAL | 3 refills | Status: AC

## 2023-07-21 MED ORDER — UBRELVY 100 MG PO TABS: 100.0000 mg | ORAL_TABLET | ORAL | Status: AC | PRN

## 2023-07-21 NOTE — Progress Notes (Signed)
 Subjective:    Patient ID: Nathan Flynn, male    DOB: Aug 08, 1985, 39 y.o.   MRN: [id number]  Nathan Flynn is a 38 y.o. male presenting on 07/21/2023 for No chief complaint on file.   HPI  Discussed the use of AI scribe software for clinical note transcription with the patient, who gave verbal consent to proceed.  History of Present Illness   Nathan Flynn is a 38 year old male who presents with persistent left eye and ear pain. He is accompanied by a family member who discusses his symptoms and weight concerns.  He has been experiencing sporadic pain in his left eye and ear since February. The pain is intermittent, sometimes affecting both eyes but always involving the left side. He describes the sensation as pressure or soreness behind the eyes, with no associated blurry vision or hearing loss. The pain is sometimes sensitive to light but does not affect his vision. He has not been taking any allergy medications or nasal sprays recently, although he was prescribed a nasal spray last year which he did not use. - Denies fever chills sick symptoms. Previous treatment with antibiotic unsuccessful  He mentions a history of swelling in the left side of his neck in the lymph node area since January, which coincides with the onset of his eye and ear pain. The swelling is sporadic and not consistently painful. The pain can become severe, affecting his ability to tolerate light and sound, and causing soreness in his neck and ear. - No prior diagnosis of Migraines  Morbid Obesity  Regarding his weight, he has been losing weight in his upper body, but his overall weight has increased, which he attributes to growths on his legs. He has not changed clothing sizes, but his shirts and pants have become looser. He reports discomfort when sitting due to the size of the growths on his thighs.  He has been more active recently, aiming for at least 5,000 steps a day, which he  believes contributes to his increased tiredness. He has not been using the compound Tirzepatide  injectable weight loss medication due to cost, having stopped around March after using a low dose.  No blurry vision or hearing loss. Sensitivity to light during episodes of pain but no persistent vision problems. No persistent swelling or drainage in the neck area.     Weight gain 484 up to 530 lbs   Suspected Sleep Apnea OSA Daytime sleepiness, sleep disturbance  Epworth Sleepiness Scale Total Score: 12 Sitting and reading - 3 Watching TV - 3 Sitting inactive in a public place - 2 As a passenger in a car for an hour without a break - 2 Lying down to rest in the afternoon when circumstances permit - 2 Sitting and talking to someone - 0 Sitting quietly after a lunch without alcohol - 0 In a car, while stopped for a few minutes in traffic - 0      07/21/2023    9:25 AM 04/28/2023    3:58 PM 01/27/2023    2:50 PM  Depression screen PHQ 2/9  Decreased Interest 0 0 0  Down, Depressed, Hopeless 0 0 0  PHQ - 2 Score 0 0 0  Altered sleeping 0  0  Tired, decreased energy 1  0  Change in appetite 0  0  Feeling bad or failure about yourself  0  0  Trouble concentrating 0  0  Moving slowly or fidgety/restless 0  0  Suicidal thoughts  0  0  PHQ-9 Score 1  0  Difficult doing work/chores Not difficult at all         07/21/2023    9:25 AM 04/28/2023    3:58 PM 01/27/2023    2:50 PM 10/28/2022    3:21 PM  GAD 7 : Generalized Anxiety Score  Nervous, Anxious, on Edge 0 0 0 0  Control/stop worrying 0 0 0 0  Worry too much - different things 0 0 0 0  Trouble relaxing 0 0 0 0  Restless 0 0 0 0  Easily annoyed or irritable 0 0 0 0  Afraid - awful might happen 0 0 0 0  Total GAD 7 Score 0 0 0 0  Anxiety Difficulty Not difficult at all Not difficult at all  Not difficult at all    Social History   Tobacco Use   Smoking status: Never   Smokeless tobacco: Never  Vaping Use   Vaping status:  Never Used  Substance Use Topics   Alcohol use: No   Drug use: No    Review of Systems Per HPI unless specifically indicated above     Objective:    BP (!) 140/88 (BP Location: Left Arm, Cuff Size: Large)   Pulse 75   Ht 6' (1.829 m)   Wt (!) 530 lb (240.4 kg)   SpO2 98%   BMI 71.88 kg/m   Wt Readings from Last 3 Encounters:  07/21/23 (!) 530 lb (240.4 kg)  04/28/23 (!) 484 lb (219.5 kg)  03/30/23 (!) 527 lb (239 kg)    Physical Exam Vitals and nursing note reviewed.  Constitutional:      General: He is not in acute distress.    Appearance: Normal appearance. He is well-developed. He is obese. He is not diaphoretic.     Comments: Well-appearing, comfortable, cooperative  HENT:     Head: Normocephalic and atraumatic.     Right Ear: Ear canal and external ear normal. There is no impacted cerumen.     Left Ear: Ear canal and external ear normal. There is no impacted cerumen.     Ears:     Comments: Similar to last visit  L TM with effusion some inflammation. No obvious erythema. No bulging. R TM mild effusion    Mouth/Throat:     Comments: L tonsil slightly enlarged compared to R Eyes:     General:        Right eye: No discharge.        Left eye: No discharge.     Conjunctiva/sclera: Conjunctivae normal.  Cardiovascular:     Rate and Rhythm: Normal rate and regular rhythm.     Pulses: Normal pulses.     Heart sounds: Normal heart sounds. No murmur heard. Pulmonary:     Effort: Pulmonary effort is normal.  Lymphadenopathy:     Cervical: No cervical adenopathy.  Skin:    General: Skin is warm and dry.     Findings: No erythema or rash.     Comments: Larger redundant soft tissue assoc with bilateral lower extremities   Neurological:     Mental Status: He is alert and oriented to person, place, and time.  Psychiatric:        Mood and Affect: Mood normal.        Behavior: Behavior normal.        Thought Content: Thought content normal.     Comments: Well  groomed, good eye contact, normal speech and thoughts  Results for orders placed or performed in visit on 03/11/23  BASIC METABOLIC PANEL WITH GFR   Collection Time: 03/11/23  7:53 AM  Result Value Ref Range   Glucose, Bld 85 65 - 139 mg/dL   BUN 15 7 - 25 mg/dL   Creat 9.52 8.41 - 3.24 mg/dL   eGFR 401 > OR = 60 UU/VOZ/3.66Y4   BUN/Creatinine Ratio SEE NOTE: 6 - 22 (calc)   Sodium 140 135 - 146 mmol/L   Potassium 4.6 3.5 - 5.3 mmol/L   Chloride 103 98 - 110 mmol/L   CO2 30 20 - 32 mmol/L   Calcium 9.6 8.6 - 10.3 mg/dL      Assessment & Plan:   Problem List Items Addressed This Visit     Morbid obesity with BMI of 70 and over, adult (HCC)   Other Visit Diagnoses       Episodic migraine    -  Primary   Relevant Medications   UBRELVY 100 MG TABS     Eustachian tube dysfunction, left       Relevant Medications   fluticasone  (FLONASE ) 50 MCG/ACT nasal spray     Sinus pressure         Suspected sleep apnea         Excessive daytime sleepiness         Excess skin of thigh           Sinusitis Eustachian Tube Dysfunction Intermittent left eye and ear pain with pressure, not linked to vision or hearing changes. Previous treatment ineffective. Clear ear fluid suggests sinus pressure. Allergies likely contributing Prior treatment with antibiotics unsuccessful - RESTART Flonase  nasal spray, two sprays each nostril daily for 4-6 weeks. - Consider ENT consultation if no improvement with nasal spray and migraine treatment.  Episodic Migraine - possible Sporadic pain in left eye and ear, sometimes both eyes, with pressure. Symptoms suggest migraine despite lack of typical headache. Trial of migraine treatment to confirm diagnosis. - Provided Ubrelvy samples (50 mg and 100 mg). Instructed to take 50 mg at symptom onset, may take 100 mg after 2 hours if needed. Do not exceed two doses in 24 hours. 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 He has had weight gain despite some reduced clothing size and measurements for upper body. He has issue with lower extremities limiting him now.  He has already seen Plastic Surgeon Dr Orin Birk in [place] 01/2023, surgery was deferred at that time, after evaluation for possible medial thigh lift.  Difficulty with weight loss due to this area of lipodystrophy impacting his mobility. Also now he is OFF Zepbound  compound, was on for 1-2 months then cost too high, off med 2+ months now.  Check with Zepbound  company and or your Insurance Plan to see if a diagnosis of Obstructive Sleep Apnea would cover the Zepbound  Rx.  Code for OSA = G47.33  If coverage for OSA, we can pursue referral for Sleep Study can do HST and determine if new diagnosis that would get med approved If this is possible, we can pursue a Home Sleep Study - [facility]   We will consider a 2nd opinion Plastic Surgery once you are ready to investigate further, send message with possible in network places and we can figure it out.  No orders of the defined types were placed in this encounter.   Meds ordered this encounter  Medications   fluticasone  (FLONASE ) 50 MCG/ACT nasal spray    Sig: Place 2 sprays into both nostrils daily. Use for 4-6 weeks then stop and use seasonally or as needed.    Dispense:  16 g    Refill:  3   UBRELVY 100 MG TABS    Sig: Take 1 tablet (100 mg total) by mouth as needed (migraine).    Follow up plan: Return if symptoms worsen or fail to improve.   Domingo Friend, DO [facility] 07/21/2023, 9:45 AM

## 2023-07-21 NOTE — Patient Instructions (Addendum)
 Thank you for coming to the office today.  Likely fluid effusion pressure in sinuses / headache and ear Start nasal steroid Flonase  2 sprays in each nostril daily for 4-6 weeks, may repeat course seasonally or as needed  Also try the Ubrelvy sample for possible migraine headache, 2 pills given 50 and 100mg  Try th 50mg  at early sign of headache, if needed can repeat the 100mg  in 2 hours, otherwise save for another day.  If effective then contact me back and we can treat it as a migraine headache.  Otherwise we may need ENT consult for the sinuses / eustachian tubes if not improving.  -----------  Check with Zepbound  company and or your Insurance Plan to see if a diagnosis of Obstructive Sleep Apnea would cover the Zepbound  Rx.  Code for OSA = G47.33  [employer]ep[employer]  TO Check Cost & Coverage of Zepbound  Please contact Research officer, trade union (manufacturer for Zepbound ) 1-800-[employer] 9028612414) - Live agent to discuss cost and coverage.  If this is possible, we can pursue a Home Sleep Study - [facility]  Check their pricing as well.  [email or web address]  --------------------------------  We will consider a 2nd opinion Plastic Surgery once you are ready to investigate further, send message with possible in network places and we can figure it out.   Please schedule a Follow-up Appointment to: Return if symptoms worsen or fail to improve.  If you have any other questions or concerns, please feel free to call the office or send a message through MyChart. You may also schedule an earlier appointment if necessary.  Additionally, you may be receiving a survey about your experience at our office within a few days to 1 week by e-mail or mail. We value your feedback.  Domingo Friend, DO [facility], [place]

## 2023-07-22 ENCOUNTER — Encounter: Payer: Self-pay | Admitting: Family Medicine

## 2023-07-22 ENCOUNTER — Other Ambulatory Visit: Payer: Self-pay | Admitting: Family Medicine

## 2023-07-22 DIAGNOSIS — I10 Essential (primary) hypertension: Secondary | ICD-10-CM

## 2023-07-23 NOTE — Telephone Encounter (Signed)
 Requested Prescriptions  Pending Prescriptions Disp Refills   valsartan  (DIOVAN ) 80 MG tablet [Pharmacy Med Name: VALSARTAN  80 MG TABLET] 90 tablet 1    Sig: TAKE 1 TABLET BY MOUTH EVERY DAY     Cardiovascular:  Angiotensin Receptor Blockers Failed - 07/23/2023  2:27 PM      Failed - Last BP in normal range    BP Readings from Last 1 Encounters:  07/21/23 (!) 140/88         Passed - Cr in normal range and within 180 days    Creat  Date Value Ref Range Status  03/11/2023 0.75 0.60 - 1.26 mg/dL Final         Passed - K in normal range and within 180 days    Potassium  Date Value Ref Range Status  03/11/2023 4.6 3.5 - 5.3 mmol/L Final         Passed - Patient is not pregnant      Passed - Valid encounter within last 6 months    Recent Outpatient Visits           2 days ago Episodic migraine   [place] [facility] [place], Kayleen Party, DO   2 months ago Morbid obesity with BMI of 60.0-69.9, adult [facility])   [place] [facility] [place], Kayleen Party, [place]

## 2023-09-16 ENCOUNTER — Encounter: Payer: Self-pay | Admitting: Family Medicine

## 2023-09-16 DIAGNOSIS — R591 Generalized enlarged lymph nodes: Secondary | ICD-10-CM

## 2023-09-16 DIAGNOSIS — H9202 Otalgia, left ear: Secondary | ICD-10-CM

## 2023-09-16 DIAGNOSIS — H6992 Unspecified Eustachian tube disorder, left ear: Secondary | ICD-10-CM

## 2023-09-22 ENCOUNTER — Other Ambulatory Visit: Payer: Self-pay | Admitting: Family Medicine

## 2023-09-22 DIAGNOSIS — I10 Essential (primary) hypertension: Secondary | ICD-10-CM

## 2023-09-23 NOTE — Telephone Encounter (Signed)
 Requested Prescriptions  Pending Prescriptions Disp Refills   amLODipine  (NORVASC ) 10 MG tablet [Pharmacy Med Name: AMLODIPINE  BESYLATE 10 MG TAB] 90 tablet 1    Sig: TAKE 1 TABLET BY MOUTH EVERY DAY     Cardiovascular: Calcium Channel Blockers 2 Failed - 09/23/2023  5:35 PM      Failed - Last BP in normal range    BP Readings from Last 1 Encounters:  07/21/23 (!) 140/88         Passed - Last Heart Rate in normal range    Pulse Readings from Last 1 Encounters:  07/21/23 75         Passed - Valid encounter within last 6 months    Recent Outpatient Visits           2 months ago Episodic migraine   [place] [facility] [place], Marsa PARAS, DO   4 months ago Morbid obesity with BMI of 60.0-69.9, adult [facility])   [place] [facility] [place], Marsa PARAS, [place]

## 2023-10-27 DIAGNOSIS — R221 Localized swelling, mass and lump, neck: Secondary | ICD-10-CM | POA: Diagnosis not present

## 2023-10-27 DIAGNOSIS — R070 Pain in throat: Secondary | ICD-10-CM | POA: Diagnosis not present

## 2023-10-27 DIAGNOSIS — H9202 Otalgia, left ear: Secondary | ICD-10-CM | POA: Diagnosis not present

## 2024-01-12 DIAGNOSIS — I890 Lymphedema, not elsewhere classified: Secondary | ICD-10-CM | POA: Diagnosis not present

## 2024-01-12 DIAGNOSIS — I872 Venous insufficiency (chronic) (peripheral): Secondary | ICD-10-CM | POA: Diagnosis not present

## 2024-01-26 ENCOUNTER — Other Ambulatory Visit: Payer: Self-pay | Admitting: Family Medicine

## 2024-01-26 DIAGNOSIS — I10 Essential (primary) hypertension: Secondary | ICD-10-CM

## 2024-01-27 NOTE — Telephone Encounter (Signed)
 Requested medications are due for refill today.  yes  Requested medications are on the active medications list.  yes  Last refill. 07/23/2023 #90 1 rf  Future visit scheduled.   no  Notes to clinic.  Labs are expired    Requested Prescriptions  Pending Prescriptions Disp Refills   valsartan  (DIOVAN ) 80 MG tablet [Pharmacy Med Name: VALSARTAN  80 MG TABLET] 90 tablet 1    Sig: TAKE 1 TABLET BY MOUTH EVERY DAY     Cardiovascular:  Angiotensin Receptor Blockers Failed - 01/27/2024  4:10 PM      Failed - Cr in normal range and within 180 days    Creat  Date Value Ref Range Status  03/11/2023 0.75 0.60 - 1.26 mg/dL Final         Failed - K in normal range and within 180 days    Potassium  Date Value Ref Range Status  03/11/2023 4.6 3.5 - 5.3 mmol/L Final         Failed - Last BP in normal range    BP Readings from Last 1 Encounters:  07/21/23 (!) 140/88         Failed - Valid encounter within last 6 months    Recent Outpatient Visits           6 months ago Episodic migraine   [place] [facility] [place] Marsa PARAS, DO   9 months ago Morbid obesity with BMI of 60.0-69.9, adult [facility])   [place] [facility] [place], Marsa PARAS, [place] - Patient is not pregnant

## 2024-03-30 ENCOUNTER — Other Ambulatory Visit: Payer: Self-pay | Admitting: Family Medicine

## 2024-03-30 DIAGNOSIS — I10 Essential (primary) hypertension: Secondary | ICD-10-CM

## 2024-03-30 NOTE — Telephone Encounter (Signed)
 Courtesy refill. Patient will need an office visit for additional refills.  Requested Prescriptions  Pending Prescriptions Disp Refills   amLODipine  (NORVASC ) 10 MG tablet [Pharmacy Med Name: AMLODIPINE  BESYLATE 10 MG TAB] 30 tablet 0    Sig: TAKE 1 TABLET BY MOUTH EVERY DAY     Cardiovascular: Calcium Channel Blockers 2 Failed - 03/30/2024  5:47 PM      Failed - Last BP in normal range    BP Readings from Last 1 Encounters:  07/21/23 (!) 140/88         Failed - Valid encounter within last 6 months    Recent Outpatient Visits           8 months ago Episodic migraine   [place] [facility] [place], Marsa PARAS, DO   11 months ago Morbid obesity with BMI of 60.0-69.9, adult [facility])   [place] [facility] [place], Marsa PARAS, [place]              Passed - Last Heart Rate in normal range    Pulse Readings from Last 1 Encounters:  07/21/23 75
